# Patient Record
Sex: Male | Born: 1980 | Hispanic: No | Smoking: Never smoker
Health system: Southern US, Community
[De-identification: ages and names within clinical notes are randomized; demographics above are authoritative.]

## PROBLEM LIST (undated history)

## (undated) DIAGNOSIS — B019 Varicella without complication: Secondary | ICD-10-CM

## (undated) DIAGNOSIS — M5126 Other intervertebral disc displacement, lumbar region: Secondary | ICD-10-CM

## (undated) DIAGNOSIS — J069 Acute upper respiratory infection, unspecified: Secondary | ICD-10-CM

## (undated) DIAGNOSIS — J45909 Unspecified asthma, uncomplicated: Secondary | ICD-10-CM

## (undated) DIAGNOSIS — T7840XA Allergy, unspecified, initial encounter: Secondary | ICD-10-CM

## (undated) HISTORY — PX: BACK SURGERY: SHX140

## (undated) HISTORY — DX: Allergy, unspecified, initial encounter: T78.40XA

## (undated) HISTORY — DX: Varicella without complication: B01.9

## (undated) HISTORY — DX: Acute upper respiratory infection, unspecified: J06.9

---

## 2015-09-23 ENCOUNTER — Ambulatory Visit: Payer: Self-pay | Admitting: Orthopedic Surgery

## 2015-10-03 NOTE — Patient Instructions (Addendum)
Darrel ReachChristopher Gullickson  10/03/2015   Your procedure is scheduled on: Thursday 10/13/2015  [facility]  Entrance take [place]  elevators to 3rd floor to  Short Stay Center at 0830 AM.  Call this number if you have problems the morning of surgery [phone]   Remember: ONLY 1 PERSON MAY GO WITH YOU TO SHORT STAY TO GET  READY MORNING OF YOUR SURGERY.   Do not eat food or drink liquids :After Midnight.     Take these medicines the morning of surgery with A SIP OF WATER :ALBUTEROL INHALER IF NEEDED AND BRING INHALER WITH YOU   DO NOT TAKE ANY DIABETIC MEDICATIONS DAY OF YOUR SURGERY                               You may not have any metal on your body including hair pins and              piercings  Do not wear jewelry, make-up, lotions, powders or perfumes, deodorant             Do not wear nail polish.  Do not shave  48 hours prior to surgery.              Men may shave face and neck.   Do not bring valuables to the hospital.  IS NOT             RESPONSIBLE   FOR VALUABLES.  Contacts, dentures or bridgework may not be worn into surgery.  Leave suitcase in the car. After surgery it may be brought to your room.     Patients discharged the day of surgery will not be allowed to drive home.  Name and phone number of your driver:  Special Instructions: N/A              Please read over the following fact sheets you were given: _____________________________________________________________________             [facility] - Pre[facility] Before surgery, you can play an important role.  Because skin is not sterile, your skin needs to be as free of germs as possible.  You can reduce the number of germs on your skin by washing with CHG (chlorahexidine gluconate) soap before surgery.  CHG is an antiseptic cleaner which kills germs and bonds with the skin to continue killing germs even after washing. Please DO NOT use if you have an  allergy to CHG or antibacterial soaps.  If your skin becomes reddened/irritated stop using the CHG and inform your nurse when you arrive at Short Stay. Do not shave (including legs and underarms) for at least 48 hours prior to the first CHG shower.  You may shave your face/neck. Please follow these instructions carefully:  1.  Shower with CHG Soap the night before surgery and the  morning of Surgery.  2.  If you choose to wash your hair, wash your hair first as usual with your  normal  shampoo.  3.  After you shampoo, rinse your hair and body thoroughly to remove the  shampoo.                           4.  Use CHG as you would any other liquid soap.  You can  apply chg directly  to the skin and wash                       Gently with a scrungie or clean washcloth.  5.  Apply the CHG Soap to your body ONLY FROM THE NECK DOWN.   Do not use on face/ open                           Wound or open sores. Avoid contact with eyes, ears mouth and genitals (private parts).                       Wash face,  Genitals (private parts) with your normal soap.             6.  Wash thoroughly, paying special attention to the area where your surgery  will be performed.  7.  Thoroughly rinse your body with warm water from the neck down.  8.  DO NOT shower/wash with your normal soap after using and rinsing off  the CHG Soap.                9.  Pat yourself dry with a clean towel.            10.  Wear clean pajamas.            11.  Place clean sheets on your bed the night of your first shower and do not  sleep with pets. Day of Surgery : Do not apply any lotions/deodorants the morning of surgery.  Please wear clean clothes to the hospital/surgery center.  FAILURE TO FOLLOW THESE INSTRUCTIONS MAY RESULT IN THE CANCELLATION OF YOUR SURGERY PATIENT SIGNATURE_________________________________  NURSE  SIGNATURE__________________________________  ________________________________________________________________________   Adam Phenix  An incentive spirometer is a tool that can help keep your lungs clear and active. This tool measures how well you are filling your lungs with each breath. Taking long deep breaths may help reverse or decrease the chance of developing breathing (pulmonary) problems (especially infection) following:  A long period of time when you are unable to move or be active. BEFORE THE PROCEDURE   If the spirometer includes an indicator to show your best effort, your nurse or respiratory therapist will set it to a desired goal.  If possible, sit up straight or lean slightly forward. Try not to slouch.  Hold the incentive spirometer in an upright position. INSTRUCTIONS FOR USE  1. Sit on the edge of your bed if possible, or sit up as far as you can in bed or on a chair. 2. Hold the incentive spirometer in an upright position. 3. Breathe out normally. 4. Place the mouthpiece in your mouth and seal your lips tightly around it. 5. Breathe in slowly and as deeply as possible, raising the piston or the ball toward the top of the column. 6. Hold your breath for 3-5 seconds or for as long as possible. Allow the piston or ball to fall to the bottom of the column. 7. Remove the mouthpiece from your mouth and breathe out normally. 8. Rest for a few seconds and repeat Steps 1 through 7 at least 10 times every 1-2 hours when you are awake. Take your time and take a few normal breaths between deep breaths. 9. The spirometer may include an indicator to show your best effort. Use the indicator as a goal to work toward during each  repetition. 10. After each set of 10 deep breaths, practice coughing to be sure your lungs are clear. If you have an incision (the cut made at the time of surgery), support your incision when coughing by placing a pillow or rolled up towels firmly  against it. Once you are able to get out of bed, walk around indoors and cough well. You may stop using the incentive spirometer when instructed by your caregiver.  RISKS AND COMPLICATIONS  Take your time so you do not get dizzy or light-headed.  If you are in pain, you may need to take or ask for pain medication before doing incentive spirometry. It is harder to take a deep breath if you are having pain. AFTER USE  Rest and breathe slowly and easily.  It can be helpful to keep track of a log of your progress. Your caregiver can provide you with a simple table to help with this. If you are using the spirometer at home, follow these instructions: Pulcifer IF:   You are having difficultly using the spirometer.  You have trouble using the spirometer as often as instructed.  Your pain medication is not giving enough relief while using the spirometer.  You develop fever of 100.5 F (38.1 C) or higher. SEEK IMMEDIATE MEDICAL CARE IF:   You cough up bloody sputum that had not been present before.  You develop fever of 102 F (38.9 C) or greater.  You develop worsening pain at or near the incision site. MAKE SURE YOU:   Understand these instructions.  Will watch your condition.  Will get help right away if you are not doing well or get worse. Document Released: 03/04/2007 Document Revised: 01/14/2012 Document Reviewed: 05/05/2007 [facility] Patient Information 2014 [place], [place].   ________________________________________________________________________

## 2015-10-04 ENCOUNTER — Other Ambulatory Visit (HOSPITAL_COMMUNITY): Payer: Self-pay | Admitting: *Deleted

## 2015-10-06 ENCOUNTER — Ambulatory Visit (HOSPITAL_COMMUNITY)
Admission: RE | Admit: 2015-10-06 | Discharge: 2015-10-06 | Disposition: A | Discharge status: Home / Self Care | Payer: BLUE CROSS/BLUE SHIELD | Source: Ambulatory Visit | Attending: Orthopedic Surgery | Admitting: Orthopedic Surgery

## 2015-10-06 ENCOUNTER — Encounter (HOSPITAL_COMMUNITY)
Admission: RE | Admit: 2015-10-06 | Discharge: 2015-10-06 | Disposition: A | Discharge status: Home / Self Care | Payer: BLUE CROSS/BLUE SHIELD | Source: Ambulatory Visit | Attending: Specialist | Admitting: Specialist

## 2015-10-06 ENCOUNTER — Ambulatory Visit: Payer: Self-pay | Admitting: Orthopedic Surgery

## 2015-10-06 ENCOUNTER — Encounter (HOSPITAL_COMMUNITY): Payer: Self-pay

## 2015-10-06 DIAGNOSIS — M5126 Other intervertebral disc displacement, lumbar region: Secondary | ICD-10-CM | POA: Diagnosis not present

## 2015-10-06 DIAGNOSIS — M5136 Other intervertebral disc degeneration, lumbar region: Secondary | ICD-10-CM | POA: Insufficient documentation

## 2015-10-06 DIAGNOSIS — Z01812 Encounter for preprocedural laboratory examination: Secondary | ICD-10-CM | POA: Insufficient documentation

## 2015-10-06 DIAGNOSIS — Z01818 Encounter for other preprocedural examination: Secondary | ICD-10-CM | POA: Insufficient documentation

## 2015-10-06 HISTORY — DX: Unspecified asthma, uncomplicated: J45.909

## 2015-10-06 HISTORY — DX: Other intervertebral disc displacement, lumbar region: M51.26

## 2015-10-06 LAB — SURGICAL PCR SCREEN
MRSA, PCR: NEGATIVE
Staphylococcus aureus: NEGATIVE

## 2015-10-06 LAB — CBC
HCT: 44.8 % (ref 39.0–52.0)
Hemoglobin: 15.2 g/dL (ref 13.0–17.0)
MCH: 28.8 pg (ref 26.0–34.0)
MCHC: 33.9 g/dL (ref 30.0–36.0)
MCV: 84.8 fL (ref 78.0–100.0)
Platelets: 171 10*3/uL (ref 150–400)
RBC: 5.28 MIL/uL (ref 4.22–5.81)
RDW: 12.5 % (ref 11.5–15.5)
WBC: 8.9 10*3/uL (ref 4.0–10.5)

## 2015-10-06 LAB — BASIC METABOLIC PANEL WITH GFR
Anion gap: 6 (ref 5–15)
BUN: 13 mg/dL (ref 6–20)
CO2: 29 mmol/L (ref 22–32)
Calcium: 9.3 mg/dL (ref 8.9–10.3)
Chloride: 103 mmol/L (ref 101–111)
Creatinine, Ser: 0.95 mg/dL (ref 0.61–1.24)
GFR calc Af Amer: >60 mL/min
GFR calc non Af Amer: >60 mL/min
Glucose, Bld: 109 mg/dL — ABNORMAL HIGH (ref 65–99)
Potassium: 4.6 mmol/L (ref 3.5–5.1)
Sodium: 138 mmol/L (ref 135–145)

## 2015-10-06 NOTE — H&P (Signed)
Blake Luna is an 34 y.o. male.   Chief Complaint: back and right leg pain HPI: The patient is a 34 year old male who presents today for follow up of their back. The patient is being followed for their back pain. They are now 8 month(s) out from when symptoms began. Symptoms reported today include: pain (right with certain movements of the right leg), leg pain (right but is better as far as ROM) and pain with sitting. The following medication has been used for pain control: none. The patient reports their current pain level to be 2 / 10. The patient presents today following MRI (08/31/2015). The patient has not gotten any relief of their symptoms with Cortisone injections. The patient indicates that they have questions or concerns today regarding surgery that is scehduled for 10/13/2015.  [name] follows up with back. He is here to discuss surgery. The surgery is scheduled for December 8. He reports he has tried to increase his level of activity. He has had no success with it. He owns his own business. This has now been a considerable period of time.  Past Medical History  Diagnosis Date  . Asthma   . HNP (herniated nucleus pulposus), lumbar     Past Surgical History  Procedure Laterality Date  . No past surgeries      No family history on file. Social History:  reports that he has never smoked. He does not have any smokeless tobacco history on file. He reports that he drinks alcohol. He reports that he does not use illicit drugs.  Allergies:  Allergies  Allergen Reactions  . Amoxicillin Other (See Comments)    Upset stomach     (Not in a hospital admission)  Results for orders placed or performed during the hospital encounter of 10/06/15 (from the past 48 hour(s))  Surgical pcr screen     Status: None   Collection Time: 10/06/15  8:10 AM  Result Value Ref Range   MRSA, PCR NEGATIVE NEGATIVE   Staphylococcus aureus NEGATIVE NEGATIVE    Comment:         The Xpert SA Assay (FDA approved for NASAL specimens in patients over 65 years of age), is one component of a comprehensive surveillance program.  Test performance has been validated by [facility] for patients greater than or equal to 33 year old. It is not intended to diagnose infection nor to guide or monitor treatment.   Basic metabolic panel     Status: Abnormal   Collection Time: 10/06/15  8:45 AM  Result Value Ref Range   Sodium 138 135 - 145 mmol/L   Potassium 4.6 3.5 - 5.1 mmol/L   Chloride 103 101 - 111 mmol/L   CO2 29 22 - 32 mmol/L   Glucose, Bld 109 (H) 65 - 99 mg/dL   BUN 13 6 - 20 mg/dL   Creatinine, Ser 0.95 0.61 - 1.24 mg/dL   Calcium 9.3 8.9 - 10.3 mg/dL   GFR calc non Af Amer >60 >60 mL/min   GFR calc Af Amer >60 >60 mL/min    Comment: (NOTE) The eGFR has been calculated using the CKD EPI equation. This calculation has not been validated in all clinical situations. eGFR's persistently <60 mL/min signify possible Chronic Kidney Disease.    Anion gap 6 5 - 15  CBC     Status: None   Collection Time: 10/06/15  8:45 AM  Result Value Ref Range   WBC 8.9 4.0 - 10.5 K/uL  RBC 5.28 4.22 - 5.81 MIL/uL   Hemoglobin 15.2 13.0 - 17.0 g/dL   HCT 44.8 39.0 - 52.0 %   MCV 84.8 78.0 - 100.0 fL   MCH 28.8 26.0 - 34.0 pg   MCHC 33.9 30.0 - 36.0 g/dL   RDW 12.5 11.5 - 15.5 %   Platelets 171 150 - 400 K/uL   Dg Lumbar Spine 2-3 Views  10/06/2015  CLINICAL DATA:  Preop decompression L5-S1 EXAM: LUMBAR SPINE - 2-3 VIEW COMPARISON:  None. FINDINGS: 5 lumbar vertebra. These are labeled for preoperative planning purposes. Normal alignment. Negative for fracture. Mild disc space narrowing L5-S1. Mild disc degeneration L1-2 and L2-3. No pars defect or focal bony abnormality identified. SI joints intact. IMPRESSION: Mild lumbar disc degeneration without acute bony abnormality Electronically Signed   By: Franchot Gallo M.D.   On: 10/06/2015 09:23    Review of Systems   Constitutional: Negative.   HENT: Negative.   Eyes: Negative.   Respiratory: Negative.   Cardiovascular: Negative.   Gastrointestinal: Negative.   Genitourinary: Negative.   Musculoskeletal: Positive for back pain.  Skin: Negative.   Neurological: Positive for sensory change and focal weakness.    There were no vitals taken for this visit. Physical Exam  Constitutional: He is oriented to person, place, and time. He appears well-developed.  HENT:  Head: Normocephalic.  Eyes: Pupils are equal, round, and reactive to light.  Neck: Normal range of motion.  Cardiovascular: Normal rate.   Respiratory: Effort normal.  GI: Soft.  Musculoskeletal:  On exam, straight leg raise is marked buttock thigh and calf pain on the right. Negative on the left. EHL is trace 5-/5.  Lumbar spine exam reveals no evidence of soft tissue swelling, deformity or skin ecchymosis. On palpation there is no tenderness of the lumbar spine. No flank pain with percussion. The abdomen is soft and nontender. Nontender over the trochanters. No cellulitis or lymphadenopathy.  Good range of motion of the lumbar spine without associated pain. Motor is 5/5 including tibialis anterior, plantar flexion, quadriceps and hamstrings. Patient is normoreflexic. There is no Babinski or clonus. Sensory exam is intact to light touch. Patient has good distal pulses. No DVT. No pain and normal range of motion without instability of the hips, knees and ankles.  Neurological: He is alert and oriented to person, place, and time.    MRI of the lumbar spine demonstrates a moderate to large disc herniation at L5-S1 on the right with displacement of the S1 nerve root. There has been no interval change since his previous MRI performed back in May or back in early June.  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   Again, we had a long discussion concerning his current pathology, relevant anatomy, and treatment options. Given the significant neural tension signs, negative impact on his activities of daily living and the failure of regression of the disc herniation by serial MRI, we mutually agreed to proceed with a lumbar decompression at L5-S1.  I had an extensive discussion of the risks and benefits of the lumbar decompression with the patient including bleeding, infection, damage to neurovascular structures, epidural fibrosis, CSF leak requiring repair. We also discussed increase in pain, adjacent segment disease, recurrent disc herniation, need for future surgery including repeat decompression and/or fusion. We also discussed risks of postoperative hematoma, paralysis, anesthetic complications including DVT, PE, death, cardiopulmonary dysfunction.  In addition, the perioperative and postoperative courses were discussed in detail including the rehabilitative time and return to functional activity and work. I provided the patient with an illustrated handout and utilized the appropriate surgical models.  Postoperatively, we discussed time out of work, sutures out in two weeks, return to physical therapy. We would appreciate if he could be reevaluated and have Dr. Janalyn Shy in [place] assist [place] in postoperative course as well. We will proceed accordingly.  Plan microlumbar decompression L5-S1 right  BISSELL, JACLYN M. PA-C for Dr. Tonita Cong 10/06/2015, 4:39 PM

## 2015-10-10 NOTE — Anesthesia Preprocedure Evaluation (Addendum)
Anesthesia Evaluation  Patient identified by MRN, date of birth, ID band Patient awake    Reviewed: Allergy & Precautions, NPO status , Patient's Chart, lab work & pertinent test results  Airway Mallampati: II   Neck ROM: Full    Dental  (+) Teeth Intact, Dental Advisory Given   Pulmonary neg pulmonary ROS,    breath sounds clear to auscultation       Cardiovascular negative cardio ROS   Rhythm:Regular     Neuro/Psych negative neurological ROS  negative psych ROS   GI/Hepatic negative GI ROS, Neg liver ROS,   Endo/Other  negative endocrine ROS  Renal/GU negative Renal ROS  negative genitourinary   Musculoskeletal negative musculoskeletal ROS (+)   Abdominal (+)  Abdomen: soft.    Peds negative pediatric ROS (+)  Hematology negative hematology ROS (+)   Anesthesia Other Findings   Reproductive/Obstetrics negative OB ROS                            Anesthesia Physical Anesthesia Plan  ASA: I  Anesthesia Plan: General   Post-op Pain Management:    Induction: Intravenous  Airway Management Planned: Oral ETT  Additional Equipment:   Intra-op Plan:   Post-operative Plan: Extubation in OR  Informed Consent: I have reviewed the patients History and Physical, chart, labs and discussed the procedure including the risks, benefits and alternatives for the proposed anesthesia with the patient or authorized representative who has indicated his/her understanding and acceptance.     Plan Discussed with:   Anesthesia Plan Comments:         Anesthesia Quick Evaluation

## 2015-10-12 MED ORDER — DEXTROSE 5 % IV SOLN
3.0000 g | INTRAVENOUS | Status: DC
  Filled 2015-10-12: qty 3000

## 2015-10-13 ENCOUNTER — Ambulatory Visit (HOSPITAL_COMMUNITY): Payer: BLUE CROSS/BLUE SHIELD

## 2015-10-13 ENCOUNTER — Ambulatory Visit (HOSPITAL_COMMUNITY): Payer: BLUE CROSS/BLUE SHIELD | Admitting: Anesthesiology

## 2015-10-13 ENCOUNTER — Encounter (HOSPITAL_COMMUNITY): Payer: Self-pay | Admitting: *Deleted

## 2015-10-13 ENCOUNTER — Encounter (HOSPITAL_COMMUNITY)
Admission: RE | Disposition: A | Discharge status: Home / Self Care | Payer: Self-pay | Source: Ambulatory Visit | Attending: Specialist

## 2015-10-13 ENCOUNTER — Ambulatory Visit (HOSPITAL_COMMUNITY)
Admission: RE | Admit: 2015-10-13 | Discharge: 2015-10-14 | Disposition: A | Discharge status: Home / Self Care | Payer: BLUE CROSS/BLUE SHIELD | Source: Ambulatory Visit | Attending: Specialist | Admitting: Specialist

## 2015-10-13 DIAGNOSIS — M4806 Spinal stenosis, lumbar region: Secondary | ICD-10-CM | POA: Diagnosis present

## 2015-10-13 DIAGNOSIS — J45909 Unspecified asthma, uncomplicated: Secondary | ICD-10-CM | POA: Insufficient documentation

## 2015-10-13 DIAGNOSIS — M549 Dorsalgia, unspecified: Secondary | ICD-10-CM

## 2015-10-13 DIAGNOSIS — M5116 Intervertebral disc disorders with radiculopathy, lumbar region: Secondary | ICD-10-CM | POA: Diagnosis not present

## 2015-10-13 DIAGNOSIS — M5126 Other intervertebral disc displacement, lumbar region: Secondary | ICD-10-CM | POA: Diagnosis present

## 2015-10-13 HISTORY — PX: LUMBAR LAMINECTOMY/DECOMPRESSION MICRODISCECTOMY: SHX5026

## 2015-10-13 SURGERY — LUMBAR LAMINECTOMY/DECOMPRESSION MICRODISCECTOMY
Anesthesia: General | Site: Back | Laterality: Right

## 2015-10-13 MED ORDER — METHOCARBAMOL 500 MG PO TABS
500.0000 mg | ORAL_TABLET | Freq: Four times a day (QID) | ORAL | Status: DC | PRN
  Administered 2015-10-13: 500 mg via ORAL
  Filled 2015-10-13: qty 1

## 2015-10-13 MED ORDER — ONDANSETRON HCL 4 MG/2ML IJ SOLN
INTRAMUSCULAR | Status: DC | PRN
  Administered 2015-10-13: 4 mg via INTRAVENOUS

## 2015-10-13 MED ORDER — THROMBIN 5000 UNITS EX SOLR
OROMUCOSAL | Status: DC | PRN
  Administered 2015-10-13: 10 mL via TOPICAL

## 2015-10-13 MED ORDER — PHENOL 1.4 % MT LIQD: 1.0000 | OROMUCOSAL | Status: DC | PRN

## 2015-10-13 MED ORDER — THROMBIN 5000 UNITS EX SOLR
CUTANEOUS | Status: AC
  Filled 2015-10-13: qty 10000

## 2015-10-13 MED ORDER — FENTANYL CITRATE (PF) 100 MCG/2ML IJ SOLN
INTRAMUSCULAR | Status: DC | PRN
  Administered 2015-10-13: 50 ug via INTRAVENOUS
  Administered 2015-10-13: 100 ug via INTRAVENOUS
  Administered 2015-10-13: 50 ug via INTRAVENOUS

## 2015-10-13 MED ORDER — BUPIVACAINE-EPINEPHRINE 0.5% -1:200000 IJ SOLN
INTRAMUSCULAR | Status: DC | PRN
  Administered 2015-10-13: 15 mL

## 2015-10-13 MED ORDER — DEXAMETHASONE SODIUM PHOSPHATE 10 MG/ML IJ SOLN
INTRAMUSCULAR | Status: AC
  Filled 2015-10-13: qty 1

## 2015-10-13 MED ORDER — ACETAMINOPHEN 650 MG RE SUPP: 650.0000 mg | RECTAL | Status: DC | PRN

## 2015-10-13 MED ORDER — LIDOCAINE HCL (CARDIAC) 20 MG/ML IV SOLN
INTRAVENOUS | Status: DC | PRN
  Administered 2015-10-13: 100 mg via INTRAVENOUS

## 2015-10-13 MED ORDER — ROCURONIUM BROMIDE 100 MG/10ML IV SOLN
INTRAVENOUS | Status: DC | PRN
  Administered 2015-10-13 (×3): 10 mg via INTRAVENOUS
  Administered 2015-10-13: 60 mg via INTRAVENOUS

## 2015-10-13 MED ORDER — ALUM & MAG HYDROXIDE-SIMETH 200-200-20 MG/5ML PO SUSP: 30.0000 mL | Freq: Four times a day (QID) | ORAL | Status: DC | PRN

## 2015-10-13 MED ORDER — POLYMYXIN B SULFATE 500000 UNITS IJ SOLR
INTRAMUSCULAR | Status: AC
  Filled 2015-10-13: qty 1

## 2015-10-13 MED ORDER — PROMETHAZINE HCL 25 MG/ML IJ SOLN: 6.2500 mg | INTRAMUSCULAR | Status: DC | PRN

## 2015-10-13 MED ORDER — BUPIVACAINE-EPINEPHRINE (PF) 0.5% -1:200000 IJ SOLN
INTRAMUSCULAR | Status: AC
  Filled 2015-10-13: qty 30

## 2015-10-13 MED ORDER — PROPOFOL 10 MG/ML IV BOLUS
INTRAVENOUS | Status: AC
  Filled 2015-10-13: qty 40

## 2015-10-13 MED ORDER — HYDROCODONE-ACETAMINOPHEN 5-325 MG PO TABS
1.0000 | ORAL_TABLET | ORAL | Status: DC | PRN
  Administered 2015-10-13 – 2015-10-14 (×5): 1 via ORAL
  Filled 2015-10-13 (×5): qty 1

## 2015-10-13 MED ORDER — FENTANYL CITRATE (PF) 100 MCG/2ML IJ SOLN: 25.0000 ug | INTRAMUSCULAR | Status: DC | PRN

## 2015-10-13 MED ORDER — DEXAMETHASONE SODIUM PHOSPHATE 10 MG/ML IJ SOLN
INTRAMUSCULAR | Status: DC | PRN
  Administered 2015-10-13: 10 mg via INTRAVENOUS

## 2015-10-13 MED ORDER — MEPERIDINE HCL 50 MG/ML IJ SOLN: 6.2500 mg | INTRAMUSCULAR | Status: DC | PRN

## 2015-10-13 MED ORDER — HYDROMORPHONE HCL 1 MG/ML IJ SOLN: 0.5000 mg | INTRAMUSCULAR | Status: DC | PRN

## 2015-10-13 MED ORDER — SENNOSIDES-DOCUSATE SODIUM 8.6-50 MG PO TABS: 1.0000 | ORAL_TABLET | Freq: Every evening | ORAL | Status: DC | PRN

## 2015-10-13 MED ORDER — DOCUSATE SODIUM 100 MG PO CAPS: 100.0000 mg | ORAL_CAPSULE | Freq: Two times a day (BID) | ORAL | Status: DC | PRN

## 2015-10-13 MED ORDER — ROCURONIUM BROMIDE 100 MG/10ML IV SOLN
INTRAVENOUS | Status: AC
  Filled 2015-10-13: qty 1

## 2015-10-13 MED ORDER — ONDANSETRON HCL 4 MG/2ML IJ SOLN
INTRAMUSCULAR | Status: AC
  Filled 2015-10-13: qty 2

## 2015-10-13 MED ORDER — ACETAMINOPHEN 10 MG/ML IV SOLN
INTRAVENOUS | Status: AC
  Filled 2015-10-13: qty 100

## 2015-10-13 MED ORDER — LACTATED RINGERS IV SOLN
INTRAVENOUS | Status: DC
  Administered 2015-10-13: 12:00:00 via INTRAVENOUS
  Administered 2015-10-13: 1000 mL via INTRAVENOUS

## 2015-10-13 MED ORDER — LACTATED RINGERS IV SOLN: INTRAVENOUS | Status: DC

## 2015-10-13 MED ORDER — RISAQUAD PO CAPS
1.0000 | ORAL_CAPSULE | Freq: Every day | ORAL | Status: DC
  Administered 2015-10-14: 1 via ORAL
  Filled 2015-10-13 (×2): qty 1

## 2015-10-13 MED ORDER — METHOCARBAMOL 1000 MG/10ML IJ SOLN
500.0000 mg | Freq: Four times a day (QID) | INTRAVENOUS | Status: DC | PRN
  Administered 2015-10-13: 500 mg via INTRAVENOUS
  Filled 2015-10-13 (×2): qty 5

## 2015-10-13 MED ORDER — SUGAMMADEX SODIUM 200 MG/2ML IV SOLN
INTRAVENOUS | Status: DC | PRN
  Administered 2015-10-13: 200 mg via INTRAVENOUS

## 2015-10-13 MED ORDER — CEFAZOLIN SODIUM-DEXTROSE 2-3 GM-% IV SOLR
INTRAVENOUS | Status: AC
  Filled 2015-10-13: qty 50

## 2015-10-13 MED ORDER — OXYCODONE-ACETAMINOPHEN 5-325 MG PO TABS: 1.0000 | ORAL_TABLET | ORAL | Status: DC | PRN

## 2015-10-13 MED ORDER — ACETAMINOPHEN 10 MG/ML IV SOLN
1000.0000 mg | Freq: Once | INTRAVENOUS | Status: AC
  Administered 2015-10-13: 1000 mg via INTRAVENOUS

## 2015-10-13 MED ORDER — MIDAZOLAM HCL 2 MG/2ML IJ SOLN
INTRAMUSCULAR | Status: AC
  Filled 2015-10-13: qty 2

## 2015-10-13 MED ORDER — FENTANYL CITRATE (PF) 250 MCG/5ML IJ SOLN
INTRAMUSCULAR | Status: AC
  Filled 2015-10-13: qty 5

## 2015-10-13 MED ORDER — MIDAZOLAM HCL 5 MG/5ML IJ SOLN
INTRAMUSCULAR | Status: DC | PRN
  Administered 2015-10-13: 2 mg via INTRAVENOUS

## 2015-10-13 MED ORDER — MENTHOL 3 MG MT LOZG: 1.0000 | LOZENGE | OROMUCOSAL | Status: DC | PRN

## 2015-10-13 MED ORDER — SUGAMMADEX SODIUM 200 MG/2ML IV SOLN
INTRAVENOUS | Status: AC
  Filled 2015-10-13: qty 2

## 2015-10-13 MED ORDER — ACETAMINOPHEN 325 MG PO TABS: 650.0000 mg | ORAL_TABLET | ORAL | Status: DC | PRN

## 2015-10-13 MED ORDER — KCL IN DEXTROSE-NACL 20-5-0.45 MEQ/L-%-% IV SOLN
INTRAVENOUS | Status: DC
  Filled 2015-10-13 (×2): qty 1000

## 2015-10-13 MED ORDER — DOCUSATE SODIUM 100 MG PO CAPS
100.0000 mg | ORAL_CAPSULE | Freq: Two times a day (BID) | ORAL | Status: DC
  Administered 2015-10-13 – 2015-10-14 (×2): 100 mg via ORAL

## 2015-10-13 MED ORDER — SODIUM CHLORIDE 0.9 % IR SOLN
Status: DC | PRN
  Administered 2015-10-13: 500 mL

## 2015-10-13 MED ORDER — LIDOCAINE HCL (CARDIAC) 20 MG/ML IV SOLN
INTRAVENOUS | Status: AC
  Filled 2015-10-13: qty 5

## 2015-10-13 MED ORDER — PROPOFOL 10 MG/ML IV BOLUS
INTRAVENOUS | Status: DC | PRN
  Administered 2015-10-13: 200 mg via INTRAVENOUS
  Administered 2015-10-13: 50 mg via INTRAVENOUS

## 2015-10-13 MED ORDER — METHOCARBAMOL 500 MG PO TABS: 500.0000 mg | ORAL_TABLET | Freq: Three times a day (TID) | ORAL | Status: DC | PRN

## 2015-10-13 MED ORDER — CEFAZOLIN SODIUM-DEXTROSE 2-3 GM-% IV SOLR
2.0000 g | Freq: Once | INTRAVENOUS | Status: AC
  Administered 2015-10-13: 2 g via INTRAVENOUS

## 2015-10-13 MED ORDER — CEFAZOLIN SODIUM-DEXTROSE 2-3 GM-% IV SOLR
2.0000 g | Freq: Three times a day (TID) | INTRAVENOUS | Status: AC
  Administered 2015-10-13 – 2015-10-14 (×3): 2 g via INTRAVENOUS
  Filled 2015-10-13 (×3): qty 50

## 2015-10-13 MED ORDER — ONDANSETRON HCL 4 MG/2ML IJ SOLN
4.0000 mg | INTRAMUSCULAR | Status: DC | PRN
  Administered 2015-10-13 (×2): 4 mg via INTRAVENOUS
  Filled 2015-10-13 (×2): qty 2

## 2015-10-13 MED ORDER — ALBUTEROL SULFATE (2.5 MG/3ML) 0.083% IN NEBU: 2.5000 mg | INHALATION_SOLUTION | Freq: Four times a day (QID) | RESPIRATORY_TRACT | Status: DC | PRN

## 2015-10-13 SURGICAL SUPPLY — 42 items
BAG ZIPLOCK 12X15 (MISCELLANEOUS) IMPLANT
CLOTH 2% CHLOROHEXIDINE 3PK (PERSONAL CARE ITEMS) ×2 IMPLANT
DRAPE MICROSCOPE LEICA (MISCELLANEOUS) ×2 IMPLANT
DRAPE SHEET LG 3/4 BI-LAMINATE (DRAPES) ×2 IMPLANT
DRAPE SURG 17X11 SM STRL (DRAPES) ×2 IMPLANT
DRAPE UTILITY XL STRL (DRAPES) ×2 IMPLANT
DRSG AQUACEL AG ADV 3.5X 4 (GAUZE/BANDAGES/DRESSINGS) ×2 IMPLANT
DRSG AQUACEL AG ADV 3.5X 6 (GAUZE/BANDAGES/DRESSINGS) IMPLANT
DURAPREP 26ML APPLICATOR (WOUND CARE) ×2 IMPLANT
DURASEAL SPINE SEALANT 3ML (MISCELLANEOUS) IMPLANT
ELECT BLADE TIP CTD 4 INCH (ELECTRODE) ×2 IMPLANT
ELECT REM PT RETURN 9FT ADLT (ELECTROSURGICAL) ×2
ELECTRODE REM PT RTRN 9FT ADLT (ELECTROSURGICAL) ×1 IMPLANT
GLOVE BIOGEL PI IND STRL 7.0 (GLOVE) ×1 IMPLANT
GLOVE BIOGEL PI INDICATOR 7.0 (GLOVE) ×1
GLOVE SURG SS PI 7.0 STRL IVOR (GLOVE) ×2 IMPLANT
GLOVE SURG SS PI 7.5 STRL IVOR (GLOVE) IMPLANT
GLOVE SURG SS PI 8.0 STRL IVOR (GLOVE) ×4 IMPLANT
GOWN STRL REUS W/TWL XL LVL3 (GOWN DISPOSABLE) ×4 IMPLANT
IV CATH 14GX2 1/4 (CATHETERS) ×2 IMPLANT
KIT BASIN OR (CUSTOM PROCEDURE TRAY) ×2 IMPLANT
KIT POSITIONING SURG ANDREWS (MISCELLANEOUS) ×2 IMPLANT
MANIFOLD NEPTUNE II (INSTRUMENTS) ×2 IMPLANT
NEEDLE SPNL 18GX3.5 QUINCKE PK (NEEDLE) ×6 IMPLANT
PACK LAMINECTOMY ORTHO (CUSTOM PROCEDURE TRAY) ×2 IMPLANT
PATTIES SURGICAL .5 X.5 (GAUZE/BANDAGES/DRESSINGS) IMPLANT
PATTIES SURGICAL .75X.75 (GAUZE/BANDAGES/DRESSINGS) ×2 IMPLANT
RUBBERBAND STERILE (MISCELLANEOUS) ×4 IMPLANT
SPONGE SURGIFOAM ABS GEL 100 (HEMOSTASIS) ×2 IMPLANT
STAPLER VISISTAT (STAPLE) IMPLANT
STRIP CLOSURE SKIN 1/2X4 (GAUZE/BANDAGES/DRESSINGS) ×2 IMPLANT
SUT NURALON 4 0 TR CR/8 (SUTURE) IMPLANT
SUT PROLENE 3 0 PS 2 (SUTURE) ×2 IMPLANT
SUT VIC AB 1 CT1 27 (SUTURE) ×1
SUT VIC AB 1 CT1 27XBRD ANTBC (SUTURE) ×1 IMPLANT
SUT VIC AB 1-0 CT2 27 (SUTURE) IMPLANT
SUT VIC AB 2-0 CT1 27 (SUTURE) ×2
SUT VIC AB 2-0 CT1 TAPERPNT 27 (SUTURE) ×2 IMPLANT
SUT VIC AB 2-0 CT2 27 (SUTURE) IMPLANT
SYR 3ML LL SCALE MARK (SYRINGE) ×2 IMPLANT
TOWEL OR 17X26 10 PK STRL BLUE (TOWEL DISPOSABLE) ×2 IMPLANT
YANKAUER SUCT BULB TIP NO VENT (SUCTIONS) ×2 IMPLANT

## 2015-10-13 NOTE — Transfer of Care (Signed)
Immediate Anesthesia Transfer of Care Note  Patient: Blake Luna  Procedure(s) Performed: Procedure(s): MICRO LUMBAR DECOMPRESSION L5 - S1 ON THE RIGHT, with specimen of l5-s1 (Right)  Patient Location: PACU  Anesthesia Type:General  Level of Consciousness: awake, alert  and oriented  Airway & Oxygen Therapy: Patient Spontanous Breathing and Patient connected to face mask oxygen  Post-op Assessment: Report given to RN and Post -op Vital signs reviewed and stable  Post vital signs: Reviewed and stable  Last Vitals:  Filed Vitals:   10/13/15 0825  BP: 155/83  Pulse: 73  Temp: 36.7 C  Resp: 18    Complications: No apparent anesthesia complications

## 2015-10-13 NOTE — Anesthesia Postprocedure Evaluation (Signed)
Anesthesia Post Note  Patient: Blake ReachChristopher Luna  Procedure(s) Performed: Procedure(s) (LRB): MICRO LUMBAR DECOMPRESSION L5 - S1 ON THE RIGHT, with specimen of l5-s1 (Right)  Patient location during evaluation: PACU Anesthesia Type: General Level of consciousness: awake and alert Pain management: pain level controlled Vital Signs Assessment: post-procedure vital signs reviewed and stable Respiratory status: spontaneous breathing, nonlabored ventilation, respiratory function stable and patient connected to nasal cannula oxygen Cardiovascular status: blood pressure returned to baseline and stable Postop Assessment: no signs of nausea or vomiting Anesthetic complications: no    Last Vitals:  Filed Vitals:   10/13/15 0825  BP: 155/83  Pulse: 73  Temp: 36.7 C  Resp: 18    Last Pain: There were no vitals filed for this visit.               Miyana Mordecai

## 2015-10-13 NOTE — H&P (View-Only) (Signed)
Blake Luna is an 34 y.o. male.   Chief Complaint: back and right leg pain HPI: The patient is a 34 year old male who presents today for follow up of their back. The patient is being followed for their back pain. They are now 8 month(s) out from when symptoms began. Symptoms reported today include: pain (right with certain movements of the right leg), leg pain (right but is better as far as ROM) and pain with sitting. The following medication has been used for pain control: none. The patient reports their current pain level to be 2 / 10. The patient presents today following MRI (08/31/2015). The patient has not gotten any relief of their symptoms with Cortisone injections. The patient indicates that they have questions or concerns today regarding surgery that is scehduled for 10/13/2015.  Blake Luna follows up with back. He is here to discuss surgery. The surgery is scheduled for December 8. He reports he has tried to increase his level of activity. He has had no success with it. He owns his own business. This has now been a considerable period of time.  Past Medical History  Diagnosis Date  . Asthma   . HNP (herniated nucleus pulposus), lumbar     Past Surgical History  Procedure Laterality Date  . No past surgeries      No family history on file. Social History:  reports that he has never smoked. He does not have any smokeless tobacco history on file. He reports that he drinks alcohol. He reports that he does not use illicit drugs.  Allergies:  Allergies  Allergen Reactions  . Amoxicillin Other (See Comments)    Upset stomach     (Not in a hospital admission)  Results for orders placed or performed during the hospital encounter of 10/06/15 (from the past 48 hour(s))  Surgical pcr screen     Status: None   Collection Time: 10/06/15  8:10 AM  Result Value Ref Range   MRSA, PCR NEGATIVE NEGATIVE   Staphylococcus aureus NEGATIVE NEGATIVE    Comment:         The Xpert SA Assay (FDA approved for NASAL specimens in patients over 59 years of age), is one component of a comprehensive surveillance program.  Test performance has been validated by [facility] for patients greater than or equal to 29 year old. It is not intended to diagnose infection nor to guide or monitor treatment.   Basic metabolic panel     Status: Abnormal   Collection Time: 10/06/15  8:45 AM  Result Value Ref Range   Sodium 138 135 - 145 mmol/L   Potassium 4.6 3.5 - 5.1 mmol/L   Chloride 103 101 - 111 mmol/L   CO2 29 22 - 32 mmol/L   Glucose, Bld 109 (H) 65 - 99 mg/dL   BUN 13 6 - 20 mg/dL   Creatinine, Ser 0.95 0.61 - 1.24 mg/dL   Calcium 9.3 8.9 - 10.3 mg/dL   GFR calc non Af Amer >60 >60 mL/min   GFR calc Af Amer >60 >60 mL/min    Comment: (NOTE) The eGFR has been calculated using the CKD EPI equation. This calculation has not been validated in all clinical situations. eGFR's persistently <60 mL/min signify possible Chronic Kidney Disease.    Anion gap 6 5 - 15  CBC     Status: None   Collection Time: 10/06/15  8:45 AM  Result Value Ref Range   WBC 8.9 4.0 - 10.5 K/uL  RBC 5.28 4.22 - 5.81 MIL/uL   Hemoglobin 15.2 13.0 - 17.0 g/dL   HCT 44.8 39.0 - 52.0 %   MCV 84.8 78.0 - 100.0 fL   MCH 28.8 26.0 - 34.0 pg   MCHC 33.9 30.0 - 36.0 g/dL   RDW 12.5 11.5 - 15.5 %   Platelets 171 150 - 400 K/uL   Dg Lumbar Spine 2-3 Views  10/06/2015  CLINICAL DATA:  Preop decompression L5-S1 EXAM: LUMBAR SPINE - 2-3 VIEW COMPARISON:  None. FINDINGS: 5 lumbar vertebra. These are labeled for preoperative planning purposes. Normal alignment. Negative for fracture. Mild disc space narrowing L5-S1. Mild disc degeneration L1-2 and L2-3. No pars defect or focal bony abnormality identified. SI joints intact. IMPRESSION: Mild lumbar disc degeneration without acute bony abnormality Electronically Signed   By: Franchot Gallo M.D.   On: 10/06/2015 09:23    Review of Systems   Constitutional: Negative.   HENT: Negative.   Eyes: Negative.   Respiratory: Negative.   Cardiovascular: Negative.   Gastrointestinal: Negative.   Genitourinary: Negative.   Musculoskeletal: Positive for back pain.  Skin: Negative.   Neurological: Positive for sensory change and focal weakness.    There were no vitals taken for this visit. Physical Exam  Constitutional: He is oriented to person, place, and time. He appears well-developed.  HENT:  Head: Normocephalic.  Eyes: Pupils are equal, round, and reactive to light.  Neck: Normal range of motion.  Cardiovascular: Normal rate.   Respiratory: Effort normal.  GI: Soft.  Musculoskeletal:  On exam, straight leg raise is marked buttock thigh and calf pain on the right. Negative on the left. EHL is trace 5-/5.  Lumbar spine exam reveals no evidence of soft tissue swelling, deformity or skin ecchymosis. On palpation there is no tenderness of the lumbar spine. No flank pain with percussion. The abdomen is soft and nontender. Nontender over the trochanters. No cellulitis or lymphadenopathy.  Good range of motion of the lumbar spine without associated pain. Motor is 5/5 including tibialis anterior, plantar flexion, quadriceps and hamstrings. Patient is normoreflexic. There is no Babinski or clonus. Sensory exam is intact to light touch. Patient has good distal pulses. No DVT. No pain and normal range of motion without instability of the hips, knees and ankles.  Neurological: He is alert and oriented to person, place, and time.    MRI of the lumbar spine demonstrates a moderate to large disc herniation at L5-S1 on the right with displacement of the S1 nerve root. There has been no interval change since his previous MRI performed back in May or back in early June.  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   Again, we had a long discussion concerning his current pathology, relevant anatomy, and treatment options. Given the significant neural tension signs, negative impact on his activities of daily living and the failure of regression of the disc herniation by serial MRI, we mutually agreed to proceed with a lumbar decompression at L5-S1.  I had an extensive discussion of the risks and benefits of the lumbar decompression with the patient including bleeding, infection, damage to neurovascular structures, epidural fibrosis, CSF leak requiring repair. We also discussed increase in pain, adjacent segment disease, recurrent disc herniation, need for future surgery including repeat decompression and/or fusion. We also discussed risks of postoperative hematoma, paralysis, anesthetic complications including DVT, PE, death, cardiopulmonary dysfunction.  In addition, the perioperative and postoperative courses were discussed in detail including the rehabilitative time and return to functional activity and work. I provided the patient with an illustrated handout and utilized the appropriate surgical models.  Postoperatively, we discussed time out of work, sutures out in two weeks, return to physical therapy. We would appreciate if he could be reevaluated and have Dr. Janalyn Shy in [place] assist [place] in postoperative course as well. We will proceed accordingly.  Plan microlumbar decompression L5-S1 right  Jemya Depierro M. PA-C for Dr. Tonita Cong 10/06/2015, 4:39 PM

## 2015-10-13 NOTE — Brief Op Note (Signed)
10/13/2015  12:30 PM  PATIENT:  Blake ReachChristopher Feldkamp  34 y.o. male  PRE-OPERATIVE DIAGNOSIS:  HNP L5 - S1 on the right  POST-OPERATIVE DIAGNOSIS:  HNP L5 - S1 on the right  PROCEDURE:  Procedure(s): MICRO LUMBAR DECOMPRESSION L5 - S1 ON THE RIGHT, with specimen of l5-s1 (Right)  SURGEON:  Surgeon(s) and Role:    * Jene EveryJeffrey Anette Barra, MD - Primary  PHYSICIAN ASSISTANT:   ASSISTANTS: Bissell   ANESTHESIA:   general  EBL:  Total I/O In: 1000 [I.V.:1000] Out: -   BLOOD ADMINISTERED:none  DRAINS: none   LOCAL MEDICATIONS USED:  MARCAINE     SPECIMEN:  No Specimen  DISPOSITION OF SPECIMEN:  PATHOLOGY  COUNTS:  YES  TOURNIQUET:  * No tourniquets in log *  DICTATION: .Other Dictation: Dictation Number [id number]  PLAN OF CARE: Admit for overnight observation  PATIENT DISPOSITION:  PACU - hemodynamically stable.   Delay start of Pharmacological VTE agent (>24hrs) due to surgical blood loss or risk of bleeding: yes

## 2015-10-13 NOTE — Discharge Instructions (Signed)
Walk As Tolerated utilizing back precautions.  No bending, twisting, or lifting.  No driving for 2 weeks.   °Aquacel dressing may remain in place until follow up. May shower with aquacel dressing in place. If the dressing peels off or becomes saturated, you may remove aquacel dressing and place gauze and tape dressing which should be kept clean and dry and changed daily. Do not remove steri-strips if they are present. °See Dr. Mikea Quadros in office in 10 to 14 days. Begin taking aspirin 81mg per day starting 4 days after your surgery if not allergic to aspirin or on another blood thinner. °Walk daily even outside. Use a cane or walker only if necessary. °Avoid sitting on soft sofas. ° °

## 2015-10-13 NOTE — Interval H&P Note (Signed)
History and Physical Interval Note:  10/13/2015 7:34 AM  Blake Luna  has presented today for surgery, with the diagnosis of HNP L5 - S1 on the right  The various methods of treatment have been discussed with the patient and family. After consideration of risks, benefits and other options for treatment, the patient has consented to  Procedure(s): MICRO LUMBAR DECOMPRESSION L5 - S1 ON THE RIGHT (Right) as a surgical intervention .  The patient's history has been reviewed, patient examined, no change in status, stable for surgery.  I have reviewed the patient's chart and labs.  Questions were answered to the patient's satisfaction.     [name]

## 2015-10-13 NOTE — Anesthesia Procedure Notes (Signed)
Procedure Name: Intubation Date/Time: 10/13/2015 10:47 AM Performed by: Thornell MuleSTUBBLEFIELD, Haruka Kowaleski G Pre-anesthesia Checklist: Patient identified, Emergency Drugs available, Suction available and Patient being monitored Patient Re-evaluated:Patient Re-evaluated prior to inductionOxygen Delivery Method: Circle System Utilized Preoxygenation: Pre-oxygenation with 100% oxygen Intubation Type: IV induction Ventilation: Mask ventilation without difficulty Laryngoscope Size: Miller and 3 Grade View: Grade I Tube type: Oral Tube size: 7.5 mm Number of attempts: 1 Airway Equipment and Method: Stylet and Oral airway Placement Confirmation: ETT inserted through vocal cords under direct vision,  positive ETCO2 and breath sounds checked- equal and bilateral Secured at: 22 cm Tube secured with: Tape Dental Injury: Teeth and Oropharynx as per pre-operative assessment

## 2015-10-13 NOTE — Op Note (Signed)
Blake Luna, Blake Luna      ACCOUNT NO.:  [id number]  MEDICAL RECORD NO.:  [id number]  LOCATION:  [facility]                         FACILITY:  [facility]  PHYSICIAN:  Jene Every, M.D.    DATE OF BIRTH:  [birth year]  DATE OF PROCEDURE:  10/13/2015 DATE OF DISCHARGE:                              OPERATIVE REPORT   PREOPERATIVE DIAGNOSIS:  Spinal stenosis, herniated nucleus pulposus at L5-S1, right.  POSTOPERATIVE DIAGNOSIS:  Spinal stenosis, herniated nucleus pulposus at L5-S1, right.  PROCEDURES PERFORMED: 1. Microlumbar decompression L5-S1, right. 2. Foraminotomy, L5-S1 right.  ANESTHESIA:  General.  ASSISTANT:  Lanna Poche, PA.  HISTORY:  This is a 34 year old with refractory S1 and L5 radiculopathy secondary large disk herniation, L5-S1, failing conservative treatment, neural tension sign, EHL, weakness, persistent pain, was indicated for lumbar decompression.  Decompressed the S1 nerve root.  Risk and benefits were discussed including bleeding, infection, damage to neurovascular structures, DVT, PE, anesthetic complications, suboptimal range of motion, need for fusion in the future, etc.  TECHNIQUE:  The patient in supine position, after induction of adequate general anesthesia 2 g Kefzol, he was placed prone on the [place] frame. All bony prominences were well padded.  Lumbar region was prepped and draped in usual sterile fashion.  Two 18-gauge spinal needle was utilized to localize the L5-S1 interspace confirmed with x-ray. Incision was made from spinous process of L5-S1, 2 cm in length. Subcutaneous tissue was dissected.  Electrocautery was utilized to achieve hemostasis.  The dorsolumbar fascia was divided in line with skin incision.  Paraspinous muscles were elevated at L5-S1.  McCullough retractor was placed.  Operating microscope was draped and brought on the surgical field.  In the interlaminar space, a straight micro curette was utilized to detach the  ligamentum flavum from the cephalad edge of S1.  Neuro patty placed beneath the ligamentum flavum.  I then performed a generous foraminotomy of S1.  Following this, we decompressed, we removed the ligamentum flavum from the interspace.  Identified the S1 nerve root which was compressed into the lateral recess protecting the S1 nerve root.  I then decompressed the lateral recess to the medial border of the pedicle with a 2 mm Kerrison.  We gently mobilized the S1 nerve root medially, was found to be adhesed onto the lateral recess to the disk herniation and an epidural venous plexus.  After gentle mobilization, identified the disk herniation.  Then we lysed epidural venous plexus.  I performed an annulotomy.  Copious portion of disk material was removed from the disk space with straight upbiting pituitary, further mobilized with a Woodson retractor and a nerve hook and an Epstein preserving the endplates.  Multiple fragments, subannular, and extruded were excised.  Then irrigated disk space with antibiotic irrigation, retrieved 2 additional small fragments.  A small foraminotomy of L5 was performed as well the L5 nerve was passed out the foramen.  There were some disk degeneration, disk space narrowing noted and therefore neural foraminal stenosis.  Confirmatory radiograph obtained.  There was 1 cm of excursion in the S1 nerve root medial pedicle without tension.  There was no residual disk herniation in the foramen L5-S1, the shoulder or the axilla of the root or beneath thecal sac.  No  evidence of CSF leakage or active bleeding was noted.  I therefore draped epidural fat over the S1 nerve root, removed the McCullough retractor, irrigated the paraspinous musculature.  I then closed the fascia with 1 Vicryl, subcu with 2-0, and skin with subcuticular Prolene.  Sterile dressing applied, placed supine on the hospital bed, extubated without difficulty, transported to the recovery room in  satisfactory condition.  The patient tolerated the procedure well.  No complications.  Assistant, Lanna PocheJacqueline Bissell, [place].  Minimal blood loss.     Jene EveryJeffrey Alaia Lordi, M.D.     Cordelia PenJB/MEDQ  D:  10/13/2015  T:  10/13/2015  Job:  [id number]

## 2015-10-14 DIAGNOSIS — M4806 Spinal stenosis, lumbar region: Secondary | ICD-10-CM | POA: Diagnosis not present

## 2015-10-14 NOTE — Progress Notes (Signed)
Patient is alert and oriented. VS stable. No s/s of acute distress. Tolerating diet well. 1 tablet needed for pain medication. Dressing is C/D/I. Reviewed discharge education, information and instructions. Patient states understanding and has no questions at this time. Removed IV and tolerated well, catheter intact.

## 2015-10-14 NOTE — Progress Notes (Signed)
Subjective: 1 Day Post-Op Procedure(s) (LRB): MICRO LUMBAR DECOMPRESSION L5 - S1 ON THE RIGHT, with specimen of l5-s1 (Right) Patient reports pain as 3 on 0-10 scale.    Objective: Vital signs in last 24 hours: Temp:  [97.8 F (36.6 C)-99 F (37.2 C)] 98.5 F (36.9 C) (12/09 0609) Pulse Rate:  [66-97] 77 (12/09 0609) Resp:  [11-18] 16 (12/09 0609) BP: (123-155)/(55-87) 133/61 mmHg (12/09 0609) SpO2:  [95 %-100 %] 97 % (12/09 0609) Weight:  [105.915 kg (233 lb 8 oz)] 105.915 kg (233 lb 8 oz) (12/08 0825)  Intake/Output from previous day: 12/08 0701 - 12/09 0700 In: 3020 [P.O.:1320; I.V.:1600; IV Piggyback:100] Out: 1074 [Urine:1054; Blood:20] Intake/Output this shift:    No results for input(s): HGB in the last 72 hours. No results for input(s): WBC, RBC, HCT, PLT in the last 72 hours. No results for input(s): NA, K, CL, CO2, BUN, CREATININE, GLUCOSE, CALCIUM in the last 72 hours. No results for input(s): LABPT, INR in the last 72 hours.  Neurologically intact Neurovascular intact Sensation intact distally Incision: dressing C/D/I No cellulitis present  Assessment/Plan: 1 Day Post-Op Procedure(s) (LRB): MICRO LUMBAR DECOMPRESSION L5 - S1 ON THE RIGHT, with specimen of l5-s1 (Right) Advance diet Discharge home with home health  Blake Luna C 10/14/2015, 7:07 AM

## 2015-10-14 NOTE — Evaluation (Signed)
Occupational Therapy Evaluation Patient Details Name: Blake ReachChristopher Luna MRN: [id number] DOB: [birth year] Today's Date: 10/14/2015    History of Present Illness s/p L5-S1 decompression   Clinical Impression   This 34 year old man was admitted for the above surgery. All education was completed. No further OT is needed at this time    Follow Up Recommendations  No OT follow up    Equipment Recommendations  None recommended by OT    Recommendations for Other Services       Precautions / Restrictions Precautions Precautions: Back Restrictions Weight Bearing Restrictions: No      Mobility Bed Mobility Overal bed mobility: Needs Assistance                Transfers Overall transfer level: Independent               General transfer comment: supervision; cued for sequence and pt return demonstrated    Balance                                            ADL Overall ADL's : Needs assistance/impaired                         Toilet Transfer: Modified Independent;Comfort height toilet             General ADL Comments: pt is able to perform ADLs with set up.  He can comfortably cross legs.  Pt has a regular commode at home but feels he will be OK--practiced on our comfort height.  He has a vanity next to it. Also discussed sitting backwards and using tank as needed. Reviewed ADLs and back precautions.  Pt verbalizes all understanding     Vision     Perception     Praxis      Pertinent Vitals/Pain Pain Assessment: 0-10 Pain Score: 4  Pain Location: back Pain Descriptors / Indicators: Sore Pain Intervention(s): Limited activity within patient's tolerance;Monitored during session;Premedicated before session;Repositioned;Ice applied     Hand Dominance     Extremity/Trunk Assessment Upper Extremity Assessment Upper Extremity Assessment: Overall WFL for tasks assessed           Communication  Communication Communication: No difficulties   Cognition Arousal/Alertness: Awake/alert Behavior During Therapy: WFL for tasks assessed/performed Overall Cognitive Status: Within Functional Limits for tasks assessed                     General Comments       Exercises       Shoulder Instructions      Home Living Family/patient expects to be discharged to:: Private residence Living Arrangements: Spouse/significant other                 Bathroom Shower/Tub: Producer, television/film/videoWalk-in shower   Bathroom Toilet: Standard     Home Equipment: None          Prior Functioning/Environment Level of Independence: Independent             OT Diagnosis: Acute pain   OT Problem List:     OT Treatment/Interventions:      OT Goals(Current goals can be found in the care plan section) Acute Rehab OT Goals OT Goal Formulation: All assessment and education complete, DC therapy  OT Frequency:     Barriers to D/C:  Co-evaluation              End of Session    Activity Tolerance: Patient tolerated treatment well Patient left:  (up in room)   Time: 1610-9604 OT Time Calculation (min): 10 min Charges:  OT General Charges $OT Visit: 1 Procedure OT Evaluation $Initial OT Evaluation Tier I: 1 Procedure G-Codes: OT G-codes **NOT FOR INPATIENT CLASS** Functional Assessment Tool Used: clinical observation and judgment Functional Limitation: Self care Self Care Current Status (V4098): At least 1 percent but less than 20 percent impaired, limited or restricted Self Care Goal Status (J1914): At least 1 percent but less than 20 percent impaired, limited or restricted Self Care Discharge Status [id number]): At least 1 percent but less than 20 percent impaired, limited or restricted  [facility] 10/14/2015, 9:11 AM  Blake Luna, OTR/L [phone] 10/14/2015

## 2017-12-27 ENCOUNTER — Ambulatory Visit: Payer: BLUE CROSS/BLUE SHIELD | Admitting: Internal Medicine

## 2018-02-04 ENCOUNTER — Ambulatory Visit (INDEPENDENT_AMBULATORY_CARE_PROVIDER_SITE_OTHER): Payer: BLUE CROSS/BLUE SHIELD

## 2018-02-04 ENCOUNTER — Encounter: Payer: Self-pay | Admitting: Internal Medicine

## 2018-02-04 ENCOUNTER — Ambulatory Visit: Payer: BLUE CROSS/BLUE SHIELD | Admitting: Internal Medicine

## 2018-02-04 ENCOUNTER — Other Ambulatory Visit (HOSPITAL_COMMUNITY)
Admission: RE | Admit: 2018-02-04 | Discharge: 2018-02-04 | Disposition: A | Discharge status: Home / Self Care | Payer: BLUE CROSS/BLUE SHIELD | Source: Ambulatory Visit | Attending: Internal Medicine | Admitting: Internal Medicine

## 2018-02-04 VITALS — BP 128/68 | HR 66 | Temp 98.4°F | Resp 16 | Ht 73.5 in | Wt 219.1 lb

## 2018-02-04 DIAGNOSIS — Z113 Encounter for screening for infections with a predominantly sexual mode of transmission: Secondary | ICD-10-CM | POA: Diagnosis not present

## 2018-02-04 DIAGNOSIS — J329 Chronic sinusitis, unspecified: Secondary | ICD-10-CM | POA: Diagnosis not present

## 2018-02-04 DIAGNOSIS — J40 Bronchitis, not specified as acute or chronic: Secondary | ICD-10-CM

## 2018-02-04 DIAGNOSIS — H6982 Other specified disorders of Eustachian tube, left ear: Secondary | ICD-10-CM | POA: Diagnosis not present

## 2018-02-04 DIAGNOSIS — J309 Allergic rhinitis, unspecified: Secondary | ICD-10-CM | POA: Insufficient documentation

## 2018-02-04 DIAGNOSIS — Z1389 Encounter for screening for other disorder: Secondary | ICD-10-CM | POA: Diagnosis not present

## 2018-02-04 DIAGNOSIS — R059 Cough, unspecified: Secondary | ICD-10-CM

## 2018-02-04 DIAGNOSIS — J069 Acute upper respiratory infection, unspecified: Secondary | ICD-10-CM

## 2018-02-04 DIAGNOSIS — R05 Cough: Secondary | ICD-10-CM | POA: Diagnosis not present

## 2018-02-04 DIAGNOSIS — H6992 Unspecified Eustachian tube disorder, left ear: Secondary | ICD-10-CM | POA: Insufficient documentation

## 2018-02-04 DIAGNOSIS — Z1329 Encounter for screening for other suspected endocrine disorder: Secondary | ICD-10-CM | POA: Diagnosis not present

## 2018-02-04 MED ORDER — METHYLPREDNISOLONE ACETATE 40 MG/ML IJ SUSP
40.0000 mg | Freq: Once | INTRAMUSCULAR | Status: AC
  Administered 2018-02-04: 40 mg via INTRAMUSCULAR

## 2018-02-04 MED ORDER — LEVOFLOXACIN 750 MG PO TABS: 750.0000 mg | ORAL_TABLET | Freq: Every day | ORAL | 0 refills | Status: DC

## 2018-02-04 MED ORDER — AZELASTINE HCL 0.1 % NA SOLN: 2.0000 | Freq: Every day | NASAL | 2 refills | Status: DC

## 2018-02-04 MED ORDER — ALBUTEROL SULFATE HFA 108 (90 BASE) MCG/ACT IN AERS: 1.0000 | INHALATION_SPRAY | Freq: Four times a day (QID) | RESPIRATORY_TRACT | 0 refills | Status: DC | PRN

## 2018-02-04 MED ORDER — FLUTICASONE PROPIONATE 50 MCG/ACT NA SUSP: 2.0000 | Freq: Every day | NASAL | 6 refills | Status: DC

## 2018-02-04 NOTE — Progress Notes (Signed)
Chief Complaint  Patient presents with  . Establish Care   New patient  1. He reports since 09/2017 he has been having cough, sinus issues and been to the urgent care in 09/2017 given Zpack which helped Tessalon perles which did not help, Allegra, Astelin which is no longer taking since Fall 2018. 12/2017 he ws given Augmentin for similar sx's which helped   Today he c/o left ear pain x months, dry cough now turning productive, nasal drainage with yellow/green and sometimes blood, reduced hearing esp left ear, He does have allergies to pollen, dust and has tried Claritin in the past not not currently using. He is using sinex OTC. He does report childhood asthma. No h/o fever. He does have a h/o asthma in childhood.  This time he has tried also OTC Mucinex. He reports the only link to him being ill is his daughter started Kindergarden the year and household members have been sick since   Agrees to STD testing today reports w/in the last 6 months slept with 2 male partners and came out to his wife.   Review of Systems  Constitutional: Negative for fever and weight loss.  HENT: Positive for congestion, ear pain and hearing loss.   Eyes: Negative for blurred vision.  Respiratory: Positive for cough. Negative for shortness of breath.   Cardiovascular: Negative for chest pain.  Gastrointestinal: Negative for heartburn.  Musculoskeletal: Negative for falls.  Skin: Negative for rash.  Neurological: Negative for headaches.  Psychiatric/Behavioral: Negative for depression.   Past Medical History:  Diagnosis Date  . Allergy   . Asthma    childhood  . Chicken pox   . HNP (herniated nucleus pulposus), lumbar    Past Surgical History:  Procedure Laterality Date  . BACK SURGERY     repair of ruptured disc L4/L5   . LUMBAR LAMINECTOMY/DECOMPRESSION MICRODISCECTOMY Right 10/13/2015   Procedure: MICRO LUMBAR DECOMPRESSION L5 - S1 ON THE RIGHT, with specimen of l5-s1;  Surgeon: Jene Every, MD;   Location: [facility];  Service: Orthopedics;  Laterality: Right;   Family History  Problem Relation Age of Onset  . Cancer Father        liver  . Diabetes Father   . Heart disease Father        chf  . Early death Father        age 43   . Hyperlipidemia Father   . Hypertension Father   . Stroke Father   . Alcohol abuse Sister   . Depression Sister   . Drug abuse Sister   . Arthritis Maternal Grandmother   . Cancer Maternal Grandmother   . Hyperlipidemia Maternal Grandmother   . Hypertension Maternal Grandmother   . Arthritis Maternal Grandfather   . Heart disease Maternal Grandfather   . Hyperlipidemia Maternal Grandfather   . Hypertension Maternal Grandfather   . Arthritis Paternal Grandfather   . Depression Paternal Grandfather   . Hyperlipidemia Paternal Grandfather   . Heart disease Paternal Grandfather   . Hypertension Paternal Grandfather   . Stroke Paternal Grandfather    Social History   Socioeconomic History  . Marital status: Married    Spouse name: Not on file  . Number of children: Not on file  . Years of education: Not on file  . Highest education level: Not on file  Occupational History  . Not on file  Social Needs  . Financial resource strain: Not on file  . Food insecurity:    Worry: Not on  file    Inability: Not on file  . Transportation needs:    Medical: Not on file    Non-medical: Not on file  Tobacco Use  . Smoking status: Never Smoker  . Smokeless tobacco: Never Used  Substance and Sexual Activity  . Alcohol use: Yes    Comment: occasional wine  . Drug use: No  . Sexual activity: Yes  Lifestyle  . Physical activity:    Days per week: Not on file    Minutes per session: Not on file  . Stress: Not on file  Relationships  . Social connections:    Talks on phone: Not on file    Gets together: Not on file    Attends religious service: Not on file    Active member of club or organization: Not on file    Attends meetings of clubs or  organizations: Not on file    Relationship status: Not on file  . Intimate partner violence:    Fear of current or ex partner: Not on file    Emotionally abused: Not on file    Physically abused: Not on file    Forced sexual activity: Not on file  Other Topics Concern  . 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 albuterol (PROVENTIL HFA;VENTOLIN HFA) 108 (90 Base) MCG/ACT inhaler Inhale 1-2 puffs into the lungs every 6 (six) hours as needed for wheezing or shortness of breath.  . [DISCONTINUED] albuterol (PROVENTIL HFA;VENTOLIN HFA) 108 (90 BASE) MCG/ACT inhaler Inhale 2 puffs into the lungs every 6 (six) hours as needed for wheezing or shortness of breath.   Allergies  Allergen Reactions  . Amoxicillin Other (See Comments)    Upset stomach   No results found for this or any previous visit (from the past 2160 hour(s)). Objective  Body mass index is 28.52 kg/m. Wt Readings from Last 3 Encounters:  02/04/18 219 lb 2 oz (99.4 kg)  10/13/15 233 lb 8 oz (105.9 kg)  10/06/15 233 lb 8 oz (105.9 kg)   Temp Readings from Last 3 Encounters:  02/04/18 98.4 F (36.9 C) (Oral)  10/14/15 97.4 F (36.3 C) (Oral)  10/06/15 98.3 F (36.8 C) (Oral)   BP Readings from Last 3 Encounters:  02/04/18 128/68  10/14/15 136/65  10/06/15 (!) 144/69   Pulse Readings from Last 3 Encounters:  02/04/18 66  10/14/15 64  10/06/15 67    Physical Exam  Constitutional: He is oriented to person, place, and time and well-developed, well-nourished, and in no distress. Vital signs are normal.  HENT:  Head: Normocephalic and atraumatic.  Mouth/Throat: Oropharynx is clear and moist and mucous membranes are normal.  Redness in b/l ears and fluid in b/l ears   Eyes: Pupils are equal, round, and reactive to light. Conjunctivae are normal.   Cardiovascular: Normal rate, regular rhythm and normal heart sounds.  Pulmonary/Chest: Effort normal.  Course breath sounds b/l   Neurological: He is alert and oriented to person, place, and time. Gait normal. Gait normal.  Skin: Skin is warm, dry and intact.  Psychiatric: Mood, memory, affect and judgment normal.  Nursing note and vitals reviewed.   Assessment   1. Bronchitis/sinusitis/otalgia L could be 2/2 eustachian tube dysfunction with h/o allergic rhinitis as well  2. HM  Plan   1. Levaquin 750 mg x 1 week, Depo 40 x 1 today, prn Albuterol  rec use OTC antihistamine, mucinex, flonase and Astelin  CXR today  F/u in 2 weeks  2.  Did not have flu shot  Ask about Tdap at f/u   STD check today see HPI, with other labs CMET, CBC, TSH, T4, UA, hep B. Will not check vit D Check lipid in future  [place] in [place] 2018  [facility] [place]    Provider: Dr. French Anaracy McLean-Scocuzza-Internal Medicine

## 2018-02-04 NOTE — Patient Instructions (Signed)
F/u in 2 weeks  Upper Respiratory Infection, Adult Most upper respiratory infections (URIs) are caused by a virus. A URI affects the nose, throat, and upper air passages. The most common type of URI is often called "the common cold." Follow these instructions at home:  Take medicines only as told by your doctor.  Gargle warm saltwater or take cough drops to comfort your throat as told by your doctor.  Use a warm mist humidifier or inhale steam from a shower to increase air moisture. This may make it easier to breathe.  Drink enough fluid to keep your pee (urine) clear or pale yellow.  Eat soups and other clear broths.  Have a healthy diet.  Rest as needed.  Go back to work when your fever is gone or your doctor says it is okay. ? You may need to stay home longer to avoid giving your URI to others. ? You can also wear a face mask and wash your hands often to prevent spread of the virus.  Use your inhaler more if you have asthma.  Do not use any tobacco products, including cigarettes, chewing tobacco, or electronic cigarettes. If you need help quitting, ask your doctor. Contact a doctor if:  You are getting worse, not better.  Your symptoms are not helped by medicine.  You have chills.  You are getting more short of breath.  You have brown or red mucus.  You have yellow or brown discharge from your nose.  You have pain in your face, especially when you bend forward.  You have a fever.  You have puffy (swollen) neck glands.  You have pain while swallowing.  You have white areas in the back of your throat. Get help right away if:  You have very bad or constant: ? Headache. ? Ear pain. ? Pain in your forehead, behind your eyes, and over your cheekbones (sinus pain). ? Chest pain.  You have long-lasting (chronic) lung disease and any of the following: ? Wheezing. ? Long-lasting cough. ? Coughing up blood. ? A change in your usual mucus.  You have a stiff  neck.  You have changes in your: ? Vision. ? Hearing. ? Thinking. ? Mood. This information is not intended to replace advice given to you by your health care provider. Make sure you discuss any questions you have with your health care provider. Document Released: 04/09/2008 Document Revised: 06/24/2016 Document Reviewed: 01/27/2014 Elsevier Interactive Patient Education  2018 [employer]

## 2018-02-05 LAB — URINALYSIS, ROUTINE W REFLEX MICROSCOPIC
BILIRUBIN URINE: NEGATIVE
HGB URINE DIPSTICK: NEGATIVE
Leukocytes, UA: NEGATIVE
NITRITE: NEGATIVE
RBC / HPF: NONE SEEN (ref 0–?)
Specific Gravity, Urine: 1.025 (ref 1.000–1.030)
Total Protein, Urine: NEGATIVE
Urine Glucose: NEGATIVE
Urobilinogen, UA: 0.2 (ref 0.0–1.0)
pH: 6 (ref 5.0–8.0)

## 2018-02-05 LAB — CBC WITH DIFFERENTIAL/PLATELET
BASOS PCT: 1.2 % (ref 0.0–3.0)
Basophils Absolute: 0.1 10*3/uL (ref 0.0–0.1)
EOS PCT: 7 % — AB (ref 0.0–5.0)
Eosinophils Absolute: 0.6 10*3/uL (ref 0.0–0.7)
HEMATOCRIT: 39.4 % (ref 39.0–52.0)
HEMOGLOBIN: 13.4 g/dL (ref 13.0–17.0)
Lymphocytes Relative: 22.6 % (ref 12.0–46.0)
Lymphs Abs: 2 10*3/uL (ref 0.7–4.0)
MCHC: 33.9 g/dL (ref 30.0–36.0)
MCV: 86.5 fl (ref 78.0–100.0)
MONO ABS: 0.6 10*3/uL (ref 0.1–1.0)
MONOS PCT: 6.9 % (ref 3.0–12.0)
Neutro Abs: 5.4 10*3/uL (ref 1.4–7.7)
Neutrophils Relative %: 62.3 % (ref 43.0–77.0)
Platelets: 231 10*3/uL (ref 150.0–400.0)
RBC: 4.56 Mil/uL (ref 4.22–5.81)
RDW: 13.8 % (ref 11.5–15.5)
WBC: 8.8 10*3/uL (ref 4.0–10.5)

## 2018-02-05 LAB — COMPREHENSIVE METABOLIC PANEL
ALT: 29 U/L (ref 0–53)
AST: 23 U/L (ref 0–37)
Albumin: 4.2 g/dL (ref 3.5–5.2)
Alkaline Phosphatase: 55 U/L (ref 39–117)
BUN: 15 mg/dL (ref 6–23)
CALCIUM: 9.5 mg/dL (ref 8.4–10.5)
CO2: 27 meq/L (ref 19–32)
Chloride: 102 mEq/L (ref 96–112)
Creatinine, Ser: 0.99 mg/dL (ref 0.40–1.50)
GFR: 90.44 mL/min (ref >60.00)
GLUCOSE: 87 mg/dL (ref 70–99)
Potassium: 3.9 mEq/L (ref 3.5–5.1)
Sodium: 137 mEq/L (ref 135–145)
TOTAL PROTEIN: 7.2 g/dL (ref 6.0–8.3)
Total Bilirubin: 0.3 mg/dL (ref 0.2–1.2)

## 2018-02-05 LAB — HEPATITIS B SURFACE ANTIGEN: HEP B S AG: NONREACTIVE

## 2018-02-05 LAB — T4, FREE: Free T4: 0.77 ng/dL (ref 0.60–1.60)

## 2018-02-05 LAB — HEPATITIS B SURFACE ANTIBODY, QUANTITATIVE: HEPATITIS B-POST: 14 m[IU]/mL (ref >=10)

## 2018-02-05 LAB — HSV 2 ANTIBODY, IGG: HSV 2 Glycoprotein G Ab, IgG: <0.9 index

## 2018-02-05 LAB — HEPATITIS C ANTIBODY
Hepatitis C Ab: NONREACTIVE
SIGNAL TO CUT-OFF: 0.02 (ref <1.00)

## 2018-02-05 LAB — RPR: RPR Ser Ql: NONREACTIVE

## 2018-02-05 LAB — HIV ANTIBODY (ROUTINE TESTING W REFLEX): HIV: NONREACTIVE

## 2018-02-05 LAB — TSH: TSH: 1.24 u[IU]/mL (ref 0.35–4.50)

## 2018-02-05 LAB — HSV 1 ANTIBODY, IGG: HSV 1 Glycoprotein G Ab, IgG: 2.62 index — ABNORMAL HIGH

## 2018-02-06 LAB — URINE CYTOLOGY ANCILLARY ONLY
CHLAMYDIA, DNA PROBE: NEGATIVE
Neisseria Gonorrhea: NEGATIVE
Trichomonas: NEGATIVE

## 2018-02-18 ENCOUNTER — Ambulatory Visit: Payer: BLUE CROSS/BLUE SHIELD | Admitting: Internal Medicine

## 2018-02-18 ENCOUNTER — Encounter: Payer: Self-pay | Admitting: Internal Medicine

## 2018-02-18 VITALS — BP 118/68 | HR 87 | Temp 98.8°F | Ht 73.5 in | Wt 216.4 lb

## 2018-02-18 DIAGNOSIS — Z13220 Encounter for screening for lipoid disorders: Secondary | ICD-10-CM

## 2018-02-18 DIAGNOSIS — Z23 Encounter for immunization: Secondary | ICD-10-CM

## 2018-02-18 DIAGNOSIS — Z0000 Encounter for general adult medical examination without abnormal findings: Secondary | ICD-10-CM | POA: Diagnosis not present

## 2018-02-18 LAB — LIPID PANEL
Cholesterol: 162 mg/dL (ref 0–200)
HDL: 56.5 mg/dL (ref >39.00)
LDL CALC: 92 mg/dL (ref 0–99)
NonHDL: 105.89
TRIGLYCERIDES: 71 mg/dL (ref 0.0–149.0)
Total CHOL/HDL Ratio: 3
VLDL: 14.2 mg/dL (ref 0.0–40.0)

## 2018-02-18 NOTE — Progress Notes (Signed)
Chief Complaint  Patient presents with  . Follow-up   Annual exam today   F/u feeling better  Reviewed labs  Overweight pt goal wt is 185/190 trying to exercise with personal trainer to lose wt 3x per day    Review of Systems  Constitutional: Positive for weight loss.       Trying for wt loss  HENT: Negative for hearing loss.   Eyes: Negative for blurred vision.  Respiratory: Negative for cough and shortness of breath.   Cardiovascular: Negative for chest pain.  Gastrointestinal: Negative for abdominal pain.  Musculoskeletal: Negative for falls.  Skin: Negative for rash.  Neurological: Negative for headaches.  Endo/Heme/Allergies: Positive for environmental allergies.  Psychiatric/Behavioral: Negative for depression.   Past Medical History:  Diagnosis Date  . Allergy   . Asthma    childhood  . Chicken pox   . HNP (herniated nucleus pulposus), lumbar    Past Surgical History:  Procedure Laterality Date  . BACK SURGERY     repair of ruptured disc L4/L5   . LUMBAR LAMINECTOMY/DECOMPRESSION MICRODISCECTOMY Right 10/13/2015   Procedure: MICRO LUMBAR DECOMPRESSION L5 - S1 ON THE RIGHT, with specimen of l5-s1;  Surgeon: Jene Every, MD;  Location: [facility];  Service: Orthopedics;  Laterality: Right;   Family History  Problem Relation Age of Onset  . Cancer Father        liver  . Diabetes Father   . Heart disease Father        chf  . Early death Father        age 40   . Hyperlipidemia Father   . Hypertension Father   . Stroke Father   . Alcohol abuse Sister   . Depression Sister   . Drug abuse Sister   . Arthritis Maternal Grandmother   . Cancer Maternal Grandmother   . Hyperlipidemia Maternal Grandmother   . Hypertension Maternal Grandmother   . Arthritis Maternal Grandfather   . Heart disease Maternal Grandfather   . Hyperlipidemia Maternal Grandfather   . Hypertension Maternal Grandfather   . Arthritis Paternal Grandfather   . Depression Paternal Grandfather    . Hyperlipidemia Paternal Grandfather   . Heart disease Paternal Grandfather   . Hypertension Paternal Grandfather   . Stroke Paternal Grandfather    Social History   Socioeconomic History  . Marital status: Married    Spouse name: Not on file  . Number of children: Not on file  . Years of education: Not on file  . Highest education level: Not on file  Occupational History  . Not on file  Social Needs  . Financial resource strain: Not on file  . Food insecurity:    Worry: Not on file    Inability: Not on file  . Transportation needs:    Medical: Not on file    Non-medical: Not on file  Tobacco Use  . Smoking status: Never Smoker  . Smokeless tobacco: Never Used  Substance and Sexual Activity  . Alcohol use: Yes    Comment: occasional wine  . Drug use: No  . Sexual activity: Yes  Lifestyle  . Physical activity:    Days per week: Not on file    Minutes per session: Not on file  . Stress: Not on file  Relationships  . Social connections:    Talks on phone: Not on file    Gets together: Not on file    Attends religious service: Not on file    Active member of  club or organization: Not on file    Attends meetings of clubs or organizations: Not on file    Relationship status: Not on file  . Intimate partner violence:    Fear of current or ex partner: Not on file    Emotionally abused: Not on file    Physically abused: Not on file    Forced sexual activity: Not on file  Other Topics Concern  . 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 albuterol (PROVENTIL HFA;VENTOLIN HFA) 108 (90 Base) MCG/ACT inhaler Inhale 1-2 puffs into the lungs every 6 (six) hours as needed for wheezing or shortness of breath.  Marland Kitchen azelastine (ASTELIN) 0.1 % nasal spray Place 2 sprays into both  nostrils daily. Use in each nostril as directed  . fluticasone (FLONASE) 50 MCG/ACT nasal spray Place 2 sprays into both nostrils daily.  Marland Kitchen loratadine (CLARITIN) 10 MG tablet Take 10 mg by mouth daily.   Allergies  Allergen Reactions  . Amoxicillin Other (See Comments)    Upset stomach   Recent Results (from the past 2160 hour(s))  Urine cytology ancillary only     Status: None   Collection Time: 02/04/18 12:00 AM  Result Value Ref Range   Chlamydia Negative     Comment: Normal Reference Range - Negative   Neisseria gonorrhea Negative     Comment: Normal Reference Range - Negative   Trichomonas Negative     Comment: Normal Reference Range - Negative  Comprehensive metabolic panel     Status: None   Collection Time: 02/04/18  3:18 PM  Result Value Ref Range   Sodium 137 135 - 145 mEq/L   Potassium 3.9 3.5 - 5.1 mEq/L   Chloride 102 96 - 112 mEq/L   CO2 27 19 - 32 mEq/L   Glucose, Bld 87 70 - 99 mg/dL   BUN 15 6 - 23 mg/dL   Creatinine, Ser 1.61 0.40 - 1.50 mg/dL   Total Bilirubin 0.3 0.2 - 1.2 mg/dL   Alkaline Phosphatase 55 39 - 117 U/L   AST 23 0 - 37 U/L   ALT 29 0 - 53 U/L   Total Protein 7.2 6.0 - 8.3 g/dL   Albumin 4.2 3.5 - 5.2 g/dL   Calcium 9.5 8.4 - 09.6 mg/dL   GFR 04.54 >09.81 mL/min  CBC with Differential/Platelet     Status: Abnormal   Collection Time: 02/04/18  3:18 PM  Result Value Ref Range   WBC 8.8 4.0 - 10.5 K/uL   RBC 4.56 4.22 - 5.81 Mil/uL   Hemoglobin 13.4 13.0 - 17.0 g/dL   HCT 19.1 47.8 - 29.5 %   MCV 86.5 78.0 - 100.0 fl   MCHC 33.9 30.0 - 36.0 g/dL   RDW 62.1 30.8 - 65.7 %   Platelets 231.0 150.0 - 400.0 K/uL   Neutrophils Relative % 62.3 43.0 - 77.0 %   Lymphocytes Relative 22.6 12.0 - 46.0 %   Monocytes Relative 6.9 3.0 - 12.0 %   Eosinophils Relative 7.0 (H) 0.0 - 5.0 %   Basophils Relative 1.2 0.0 - 3.0 %   Neutro Abs 5.4 1.4 - 7.7 K/uL   Lymphs Abs 2.0 0.7 - 4.0 K/uL  Monocytes Absolute 0.6 0.1 - 1.0 K/uL   Eosinophils Absolute  0.6 0.0 - 0.7 K/uL   Basophils Absolute 0.1 0.0 - 0.1 K/uL  T4, free     Status: None   Collection Time: 02/04/18  3:18 PM  Result Value Ref Range   Free T4 0.77 0.60 - 1.60 ng/dL    Comment: Specimens from patients who are undergoing biotin therapy and /or ingesting biotin supplements may contain high levels of biotin.  The higher biotin concentration in these specimens interferes with this Free T4 assay.  Specimens that contain high levels  of biotin may cause false high results for this Free T4 assay.  Please interpret results in light of the total clinical presentation of the patient.    TSH     Status: None   Collection Time: 02/04/18  3:18 PM  Result Value Ref Range   TSH 1.24 0.35 - 4.50 uIU/mL  Urinalysis, Routine w reflex microscopic     Status: Abnormal   Collection Time: 02/04/18  3:18 PM  Result Value Ref Range   Color, Urine YELLOW Yellow;Lt. Yellow   APPearance CLEAR Clear   Specific Gravity, Urine 1.025 1.000 - 1.030   pH 6.0 5.0 - 8.0   Total Protein, Urine NEGATIVE Negative   Urine Glucose NEGATIVE Negative   Ketones, ur TRACE (A) Negative   Bilirubin Urine NEGATIVE Negative   Hgb urine dipstick NEGATIVE Negative   Urobilinogen, UA 0.2 0.0 - 1.0   Leukocytes, UA NEGATIVE Negative   Nitrite NEGATIVE Negative   WBC, UA 0-2/hpf 0-2/hpf   RBC / HPF none seen 0-2/hpf   Squamous Epithelial / LPF Rare(0-4/hpf) Rare(0-4/hpf)  Hepatitis B surface antibody     Status: None   Collection Time: 02/04/18  3:18 PM  Result Value Ref Range   Hepatitis B-Post 14 > OR = 10 mIU/mL    Comment: . Patient has immunity to hepatitis B virus. . For additional information, please refer to http://education.[facility].com/faq/FAQ105 (This link is being provided for informational/ educational purposes only).   HIV antibody (with reflex)     Status: None   Collection Time: 02/04/18  3:18 PM  Result Value Ref Range   HIV 1&2 Ab, 4th Generation NON-REACTIVE NON-REACTI    Comment:  HIV-1 antigen and HIV-1/HIV-2 antibodies were not detected. There is no laboratory evidence of HIV infection. Marland Kitchen. PLEASE NOTE: This information has been disclosed to you from records whose confidentiality may be protected by state law.  If your state requires such protection, then the state law prohibits you from making any further disclosure of the information without the specific written consent of the person to whom it pertains, or as otherwise permitted by law. A general authorization for the release of medical or other information is NOT sufficient for this purpose. . For additional information please refer to http://education.[facility].com/faq/FAQ106 (This link is being provided for informational/ educational purposes only.) . Marland Kitchen. The performance of this assay has not been clinically validated in patients less than 148 years old. .   Hepatitis B surface antigen     Status: None   Collection Time: 02/04/18  3:18 PM  Result Value Ref Range   Hepatitis B Surface Ag NON-REACTIVE NON-REACTI  RPR     Status: None   Collection Time: 02/04/18  3:18 PM  Result Value Ref Range   RPR Ser Ql NON-REACTIVE NON-REACTI  Hepatitis C antibody     Status: None   Collection Time: 02/04/18  3:22 PM  Result Value  Ref Range   Hepatitis C Ab NON-REACTIVE NON-REACTI   SIGNAL TO CUT-OFF 0.02 <1.00    Comment: . HCV antibody was non-reactive. There is no laboratory  evidence of HCV infection. . In most cases, no further action is required. However, if recent HCV exposure is suspected, a test for HCV RNA (test code 16109) is suggested. . For additional information please refer to http://education.[facility].com/faq/FAQ22v1 (This link is being provided for informational/ educational purposes only.) .   HSV 1 antibody, IgG     Status: Abnormal   Collection Time: 02/04/18  3:22 PM  Result Value Ref Range   HSV 1 Glycoprotein G Ab, IgG 2.62 (H) index    Comment:                            Index          Interpretation                           -----          --------------                           <0.90          Negative                           0.90-1.09      Equivocal                           >1.09          Positive . This assay utilizes recombinant type-specific antigens to differentiate HSV-1 from HSV-2 infections. A positive result cannot distinguish between recent and past infection. If recent HSV infection is suspected but the results are negative or equivocal, the assay should be repeated in 4-6 weeks. The performance characteristics of the assay have not been established for pediatric populations, immunocompromised patients, or neonatal screening.   HSV 2 antibody, IgG     Status: None   Collection Time: 02/04/18  3:22 PM  Result Value Ref Range   HSV 2 Glycoprotein G Ab, IgG <0.90 index    Comment:                           Index          Interpretation                           -----          --------------                           <0.90          Negative                           0.90-1.09      Equivocal                           >1.09          Positive . This assay utilizes recombinant type-specific antigens to differentiate HSV-1 from HSV-2 infections. A positive result cannot  distinguish between recent and past infection. If recent HSV infection is suspected but the results are negative or equivocal, the assay should be repeated in 4-6 weeks. The performance characteristics of the assay have not been established for pediatric populations, immunocompromised patients, or neonatal screening.    Objective  Body mass index is 28.16 kg/m. Wt Readings from Last 3 Encounters:  02/18/18 216 lb 6.4 oz (98.2 kg)  02/04/18 219 lb 2 oz (99.4 kg)  10/13/15 233 lb 8 oz (105.9 kg)   Temp Readings from Last 3 Encounters:  02/18/18 98.8 F (37.1 C) (Oral)  02/04/18 98.4 F (36.9 C) (Oral)  10/14/15 97.4 F (36.3 C) (Oral)   BP Readings from  Last 3 Encounters:  02/18/18 118/68  02/04/18 128/68  10/14/15 136/65   Pulse Readings from Last 3 Encounters:  02/18/18 87  02/04/18 66  10/14/15 64    Physical Exam  Constitutional: He is oriented to person, place, and time. Vital signs are normal. He appears well-developed and well-nourished.  HENT:  Head: Normocephalic and atraumatic.  Mouth/Throat: Oropharynx is clear and moist and mucous membranes are normal.  Eyes: Pupils are equal, round, and reactive to light. Conjunctivae are normal.  Cardiovascular: Normal rate, regular rhythm and normal heart sounds.  Pulmonary/Chest: Effort normal and breath sounds normal.  Abdominal: Soft. Normal appearance.  Neurological: He is alert and oriented to person, place, and time. Gait normal.  Skin: Skin is warm, dry and intact.  Psychiatric: He has a normal mood and affect. His speech is normal and behavior is normal. Judgment and thought content normal. Cognition and memory are normal.  Nursing note and vitals reviewed.   Assessment   1. Annual exam  2. Overweight BMI 28.16  3.HM Plan  1.  Reviewed labs  rec continue exercise and healthy diet choices  2.  Cont exercise to lose wt 3x per week goal 185-190  3.  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 French Ana McLean-Scocuzza-Internal Medicine

## 2018-02-18 NOTE — Patient Instructions (Signed)
Keep up the good work goal weight 185-190 labs    Exercising to Lose Weight Exercising can help you to lose weight. In order to lose weight through exercise, you need to do vigorous-intensity exercise. You can tell that you are exercising with vigorous intensity if you are breathing very hard and fast and cannot hold a conversation while exercising. Moderate-intensity exercise helps to maintain your current weight. You can tell that you are exercising at a moderate level if you have a higher heart rate and faster breathing, but you are still able to hold a conversation. How often should I exercise? Choose an activity that you enjoy and set realistic goals. Your health care provider can help you to make an activity plan that works for you. Exercise regularly as directed by your health care provider. This may include:  Doing resistance training twice each week, such as: ? Push-ups. ? Sit-ups. ? Lifting weights. ? Using resistance bands.  Doing a given intensity of exercise for a given amount of time. Choose from these options: ? 150 minutes of moderate-intensity exercise every week. ? 75 minutes of vigorous-intensity exercise every week. ? A mix of moderate-intensity and vigorous-intensity exercise every week.  Children, pregnant women, people who are out of shape, people who are overweight, and older adults may need to consult a health care provider for individual recommendations. If you have any sort of medical condition, be sure to consult your health care provider before starting a new exercise program. What are some activities that can help me to lose weight?  Walking at a rate of at least 4.5 miles an hour.  Jogging or running at a rate of 5 miles per hour.  Biking at a rate of at least 10 miles per hour.  Lap swimming.  Roller-skating or in-line skating.  Cross-country skiing.  Vigorous competitive sports, such as football, basketball, and soccer.  Jumping rope.  Aerobic  dancing. How can I be more active in my day-to-day activities?  Use the stairs instead of the elevator.  Take a walk during your lunch break.  If you drive, park your car farther away from work or school.  If you take public transportation, get off one stop early and walk the rest of the way.  Make all of your phone calls while standing up and walking around.  Get up, stretch, and walk around every 30 minutes throughout the day. What guidelines should I follow while exercising?  Do not exercise so much that you hurt yourself, feel dizzy, or get very short of breath.  Consult your health care provider prior to starting a new exercise program.  Wear comfortable clothes and shoes with good support.  Drink plenty of water while you exercise to prevent dehydration or heat stroke. Body water is lost during exercise and must be replaced.  Work out until you breathe faster and your heart beats faster. This information is not intended to replace advice given to you by your health care provider. Make sure you discuss any questions you have with your health care provider. Document Released: 11/24/2010 Document Revised: 03/29/2016 Document Reviewed: 03/25/2014 Elsevier Interactive Patient Education  [employer]2018 Elsevier Inc.

## 2018-02-18 NOTE — Progress Notes (Signed)
Pre visit review using our clinic review tool, if applicable. No additional management support is needed unless otherwise documented below in the visit note. 

## 2019-02-17 ENCOUNTER — Telehealth: Payer: Self-pay

## 2019-02-17 NOTE — Telephone Encounter (Signed)
Copied from CRM [id number]. Topic: General - Other >> Feb 17, 2019 12:25 PM Lorrine Kin, NT wrote: Reason for CRM: Patient returning a call to [facility] regarding his CPE appointment. Patient states that he will call back in a few months to reschedule this appointment.

## 2019-02-19 ENCOUNTER — Encounter: Payer: BLUE CROSS/BLUE SHIELD | Admitting: Internal Medicine

## 2019-07-14 ENCOUNTER — Other Ambulatory Visit: Payer: Self-pay

## 2019-07-14 DIAGNOSIS — Z20822 Contact with and (suspected) exposure to covid-19: Secondary | ICD-10-CM

## 2019-07-16 LAB — NOVEL CORONAVIRUS, NAA: SARS-CoV-2, NAA: NOT DETECTED

## 2019-08-02 IMAGING — DX DG CHEST 2V
2 series · 2 of 2 positions shown · non-contrast
Comparison: None.

CLINICAL DATA: cough on/off since [DATE]

EXAM:
CHEST - 2 VIEW

[chest pa]
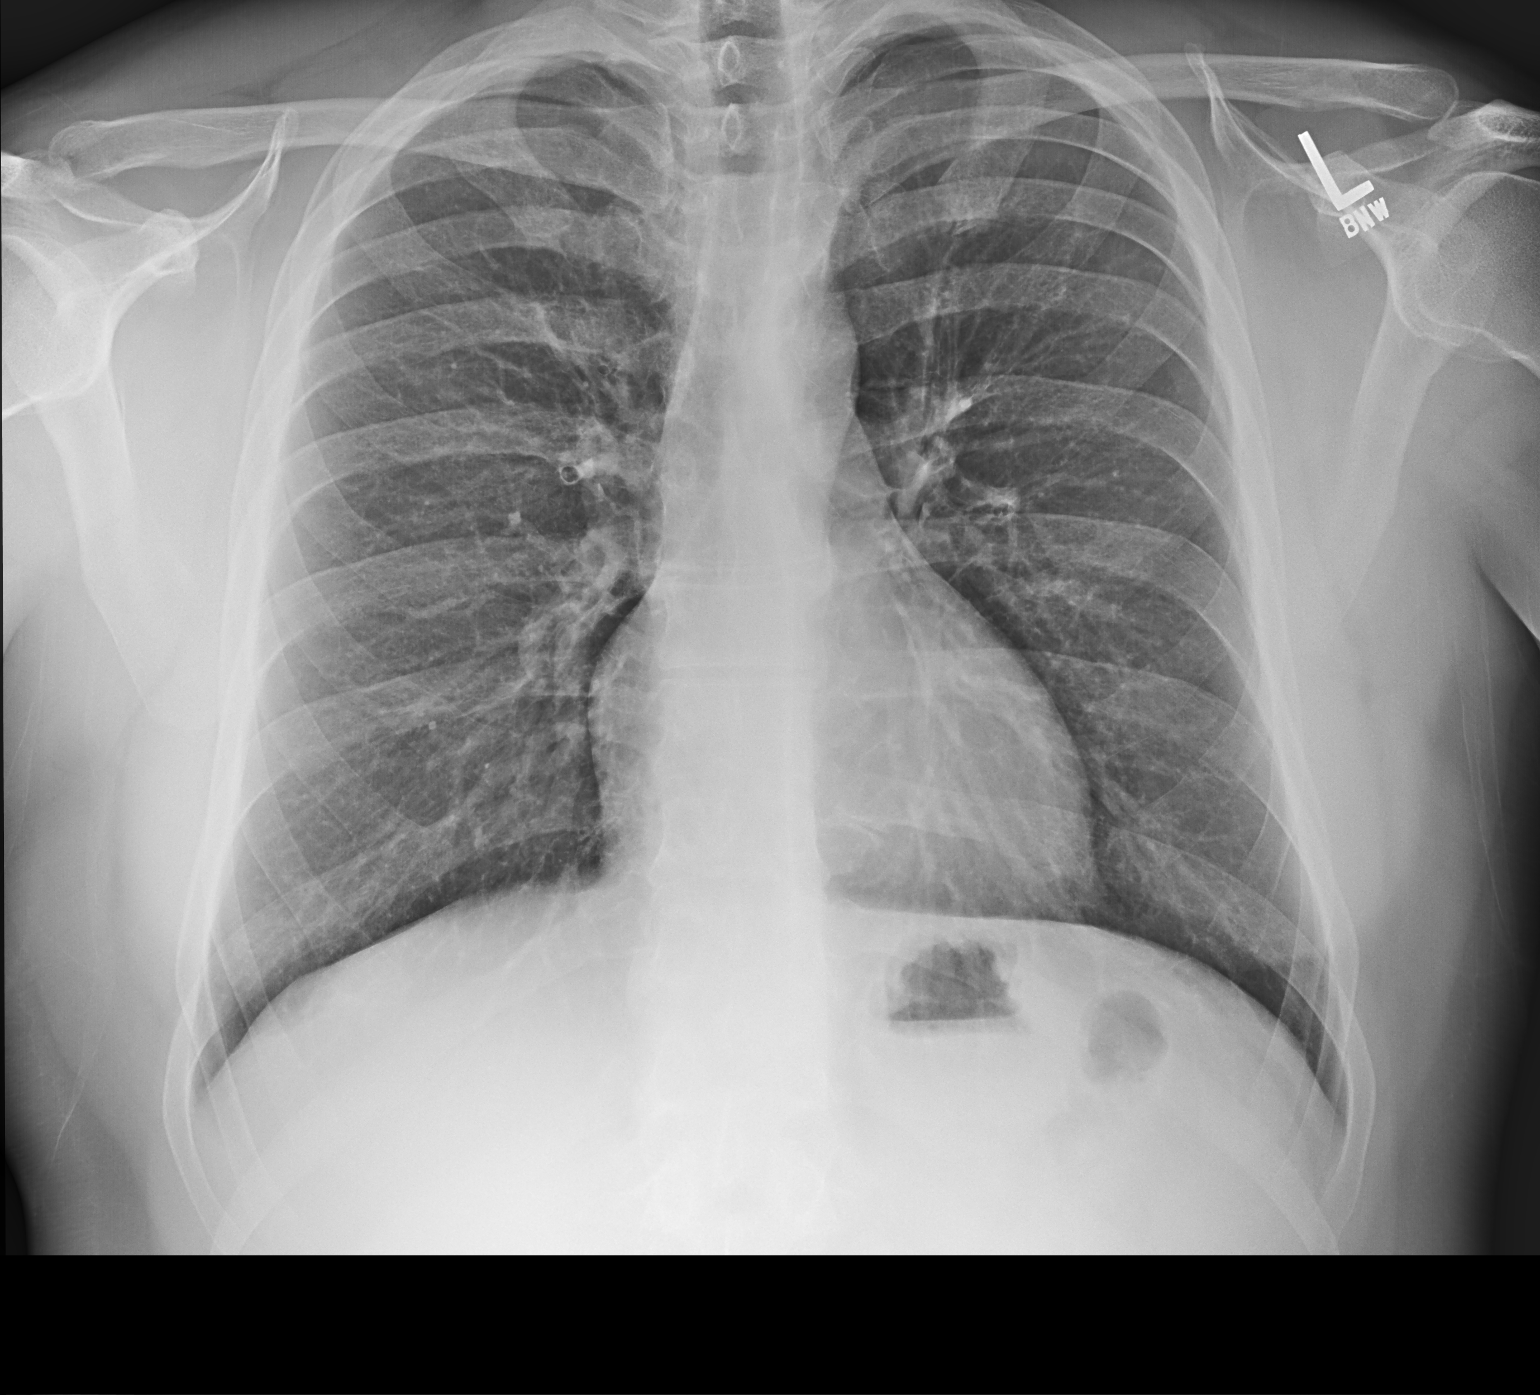

[chest lat]
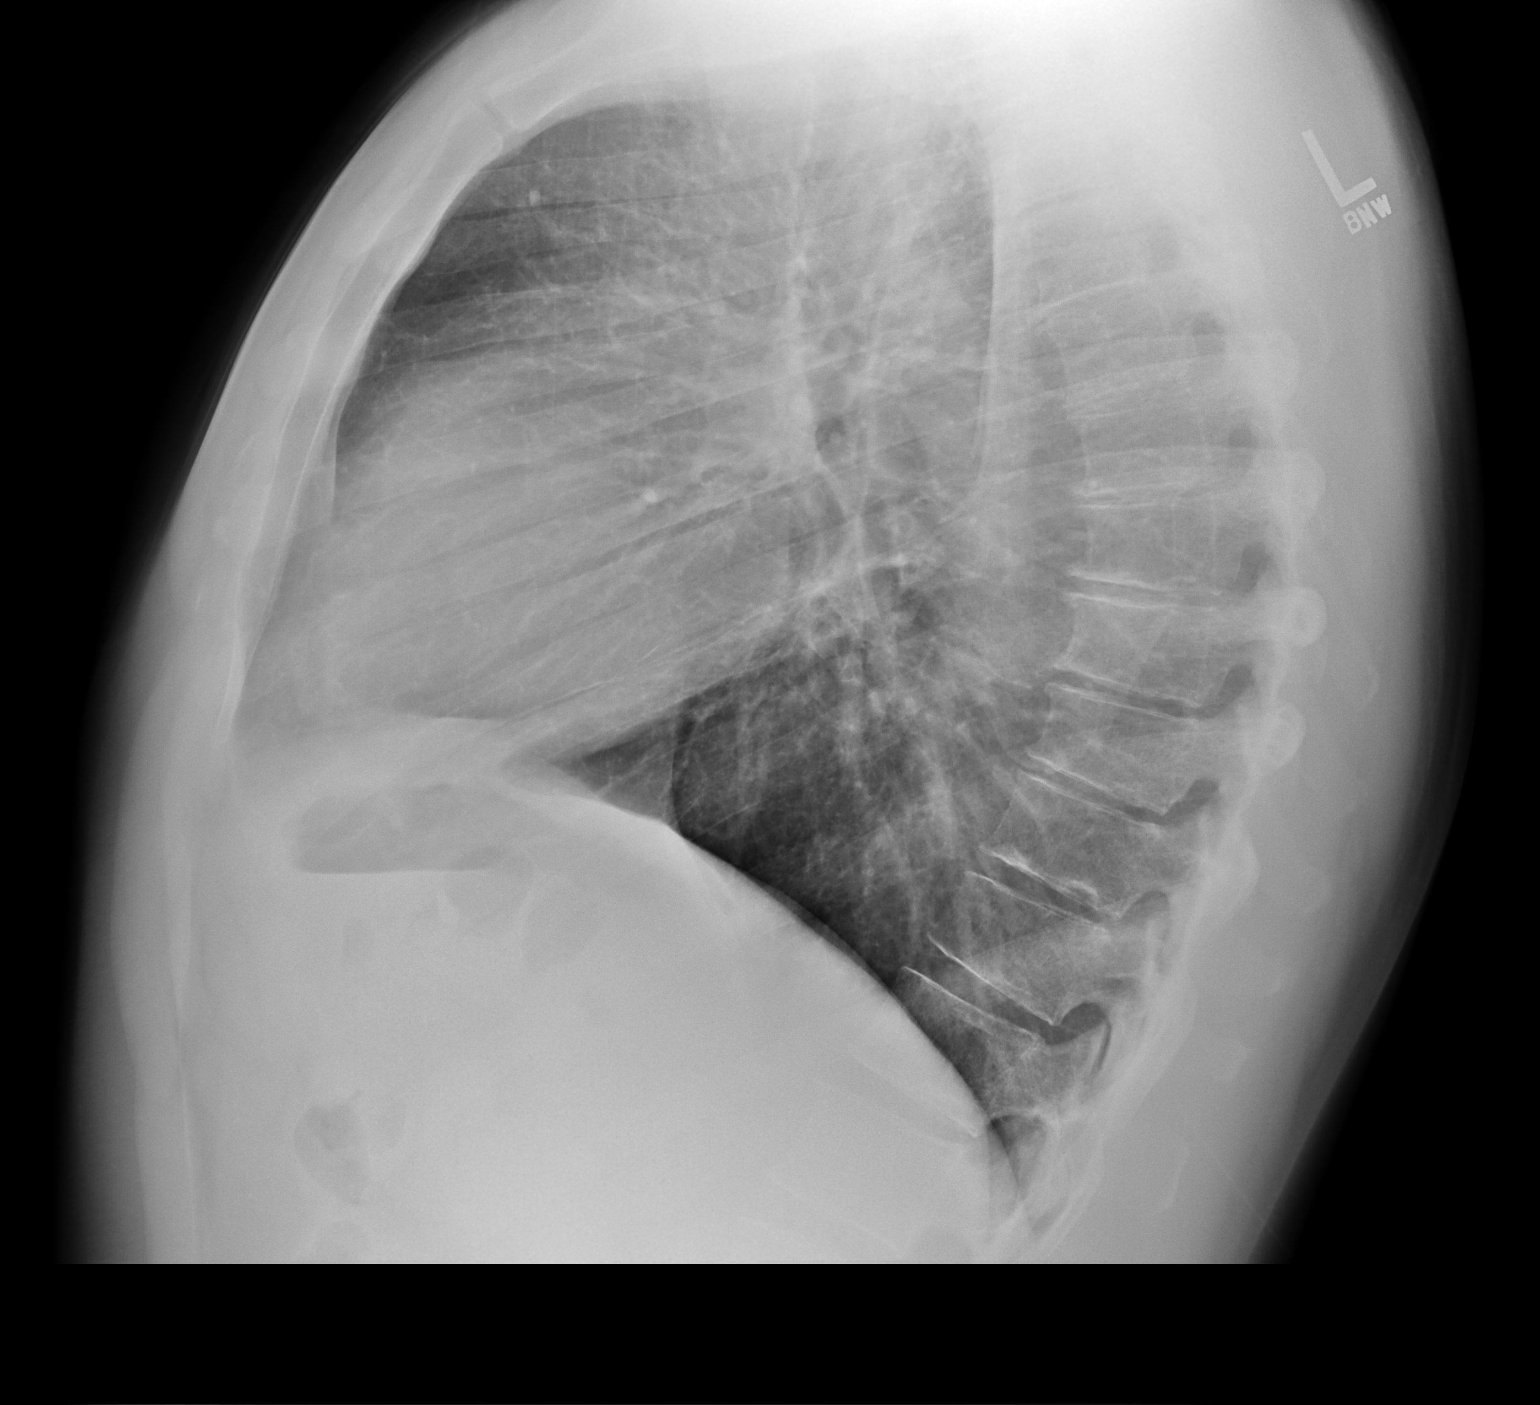

[2 of 2 positions shown; findings below may reference images not displayed]

FINDINGS: Normal heart size. Lungs clear. No pneumothorax. No pleural
effusion. Mid-level thoracic compression deformities have a chronic
appearance.
IMPRESSION: No active cardiopulmonary disease.

## 2019-08-25 ENCOUNTER — Other Ambulatory Visit: Payer: Self-pay

## 2019-08-25 DIAGNOSIS — Z20822 Contact with and (suspected) exposure to covid-19: Secondary | ICD-10-CM

## 2019-08-27 LAB — NOVEL CORONAVIRUS, NAA: SARS-CoV-2, NAA: NOT DETECTED

## 2020-01-28 ENCOUNTER — Other Ambulatory Visit: Payer: Self-pay

## 2020-01-28 ENCOUNTER — Encounter: Payer: Self-pay | Admitting: Internal Medicine

## 2020-01-28 ENCOUNTER — Ambulatory Visit (INDEPENDENT_AMBULATORY_CARE_PROVIDER_SITE_OTHER): Payer: BC Managed Care – PPO | Admitting: Internal Medicine

## 2020-01-28 ENCOUNTER — Other Ambulatory Visit (HOSPITAL_COMMUNITY)
Admission: RE | Admit: 2020-01-28 | Discharge: 2020-01-28 | Disposition: A | Discharge status: Home / Self Care | Payer: BC Managed Care – PPO | Source: Ambulatory Visit | Attending: Internal Medicine | Admitting: Internal Medicine

## 2020-01-28 VITALS — BP 128/82 | HR 80 | Temp 98.5°F | Ht 73.5 in | Wt 232.2 lb

## 2020-01-28 DIAGNOSIS — E559 Vitamin D deficiency, unspecified: Secondary | ICD-10-CM

## 2020-01-28 DIAGNOSIS — Z0000 Encounter for general adult medical examination without abnormal findings: Secondary | ICD-10-CM

## 2020-01-28 DIAGNOSIS — Z7253 High risk bisexual behavior: Secondary | ICD-10-CM

## 2020-01-28 DIAGNOSIS — J40 Bronchitis, not specified as acute or chronic: Secondary | ICD-10-CM | POA: Diagnosis not present

## 2020-01-28 DIAGNOSIS — Z1329 Encounter for screening for other suspected endocrine disorder: Secondary | ICD-10-CM | POA: Diagnosis not present

## 2020-01-28 DIAGNOSIS — Z202 Contact with and (suspected) exposure to infections with a predominantly sexual mode of transmission: Secondary | ICD-10-CM

## 2020-01-28 DIAGNOSIS — Z1159 Encounter for screening for other viral diseases: Secondary | ICD-10-CM

## 2020-01-28 DIAGNOSIS — Z113 Encounter for screening for infections with a predominantly sexual mode of transmission: Secondary | ICD-10-CM | POA: Diagnosis not present

## 2020-01-28 DIAGNOSIS — Z1389 Encounter for screening for other disorder: Secondary | ICD-10-CM

## 2020-01-28 DIAGNOSIS — Z79899 Other long term (current) drug therapy: Secondary | ICD-10-CM

## 2020-01-28 DIAGNOSIS — Z0184 Encounter for antibody response examination: Secondary | ICD-10-CM

## 2020-01-28 DIAGNOSIS — J309 Allergic rhinitis, unspecified: Secondary | ICD-10-CM | POA: Diagnosis not present

## 2020-01-28 LAB — CBC WITH DIFFERENTIAL/PLATELET
Basophils Absolute: 0 10*3/uL (ref 0.0–0.1)
Basophils Relative: 0.6 % (ref 0.0–3.0)
Eosinophils Absolute: 0.3 10*3/uL (ref 0.0–0.7)
Eosinophils Relative: 4.2 % (ref 0.0–5.0)
HCT: 41.6 % (ref 39.0–52.0)
Hemoglobin: 14 g/dL (ref 13.0–17.0)
Lymphocytes Relative: 22.5 % (ref 12.0–46.0)
Lymphs Abs: 1.5 10*3/uL (ref 0.7–4.0)
MCHC: 33.5 g/dL (ref 30.0–36.0)
MCV: 87.3 fl (ref 78.0–100.0)
Monocytes Absolute: 0.5 10*3/uL (ref 0.1–1.0)
Monocytes Relative: 8.1 % (ref 3.0–12.0)
Neutro Abs: 4.2 10*3/uL (ref 1.4–7.7)
Neutrophils Relative %: 64.6 % (ref 43.0–77.0)
Platelets: 190 10*3/uL (ref 150.0–400.0)
RBC: 4.77 Mil/uL (ref 4.22–5.81)
RDW: 12.9 % (ref 11.5–15.5)
WBC: 6.5 10*3/uL (ref 4.0–10.5)

## 2020-01-28 LAB — COMPREHENSIVE METABOLIC PANEL
ALT: 33 U/L (ref 0–53)
AST: 19 U/L (ref 0–37)
Albumin: 4.5 g/dL (ref 3.5–5.2)
Alkaline Phosphatase: 52 U/L (ref 39–117)
BUN: 16 mg/dL (ref 6–23)
CO2: 27 mEq/L (ref 19–32)
Calcium: 9.1 mg/dL (ref 8.4–10.5)
Chloride: 104 mEq/L (ref 96–112)
Creatinine, Ser: 0.95 mg/dL (ref 0.40–1.50)
GFR: 88.3 mL/min (ref >60.00)
Glucose, Bld: 104 mg/dL — ABNORMAL HIGH (ref 70–99)
Potassium: 4 mEq/L (ref 3.5–5.1)
Sodium: 136 mEq/L (ref 135–145)
Total Bilirubin: 0.4 mg/dL (ref 0.2–1.2)
Total Protein: 7.2 g/dL (ref 6.0–8.3)

## 2020-01-28 LAB — TSH: TSH: 2.01 u[IU]/mL (ref 0.35–4.50)

## 2020-01-28 LAB — VITAMIN D 25 HYDROXY (VIT D DEFICIENCY, FRACTURES): VITD: 33.14 ng/mL (ref 30.00–100.00)

## 2020-01-28 MED ORDER — AZITHROMYCIN 500 MG PO TABS: ORAL_TABLET | ORAL | 0 refills | Status: DC

## 2020-01-28 MED ORDER — ALBUTEROL SULFATE HFA 108 (90 BASE) MCG/ACT IN AERS: 1.0000 | INHALATION_SPRAY | Freq: Four times a day (QID) | RESPIRATORY_TRACT | 11 refills | Status: DC | PRN

## 2020-01-28 MED ORDER — DOXYCYCLINE HYCLATE 100 MG PO TABS: 100.0000 mg | ORAL_TABLET | Freq: Two times a day (BID) | ORAL | 0 refills | Status: DC

## 2020-01-28 MED ORDER — LORATADINE 10 MG PO TABS: 10.0000 mg | ORAL_TABLET | Freq: Every day | ORAL | 3 refills | Status: DC

## 2020-01-28 MED ORDER — FLUTICASONE PROPIONATE 50 MCG/ACT NA SUSP: 2.0000 | Freq: Every day | NASAL | 11 refills | Status: DC

## 2020-01-28 MED ORDER — AZELASTINE HCL 0.1 % NA SOLN: 2.0000 | Freq: Every day | NASAL | 2 refills | Status: DC

## 2020-01-28 NOTE — Addendum Note (Signed)
Addended by: Tilford Pillar on: 01/28/2020 11:52 AM   Modules accepted: Orders

## 2020-01-28 NOTE — Progress Notes (Signed)
Chief Complaint  Patient presents with  . Exposure to STD    Patient had recent exposure Sunday 01/17/20, would like STi testing and possibly starting PREP   F/u  1. STD check having sex with men and recently had 01/17/20 unprotected sex and partner + chlamydia new change separated/divorced wife he is possibly interested in PREP He does c/o lower ab pain  2. 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  HENT: Negative for hearing loss.   Eyes: Negative for blurred vision.  Respiratory: Negative for shortness of breath.   Cardiovascular: Negative for chest pain.  Gastrointestinal: Positive for abdominal pain.  Genitourinary: Negative for dysuria.  Musculoskeletal: Negative for falls.  Skin: Negative for rash.  Neurological: Negative for headaches.  Psychiatric/Behavioral: Negative for depression.   Past Medical History:  Diagnosis Date  . Allergy   . Asthma    childhood  . Chicken pox   . HNP (herniated nucleus pulposus), lumbar    Past Surgical History:  Procedure Laterality Date  . BACK SURGERY     repair of ruptured disc L4/L5   . LUMBAR LAMINECTOMY/DECOMPRESSION MICRODISCECTOMY Right 10/13/2015   Procedure: MICRO LUMBAR DECOMPRESSION L5 - S1 ON THE RIGHT, with specimen of l5-s1;  Surgeon: Susa Day, MD;  Location: [facility];  Service: Orthopedics;  Laterality: Right;   Family History  Problem Relation Age of Onset  . Cancer Father        liver  . Diabetes Father   . Heart disease Father        chf  . Early death Father        age 85   . Hyperlipidemia Father   . Hypertension Father   . Stroke Father   . Alcohol abuse Sister   . Depression Sister   . Drug abuse Sister   . Arthritis Maternal Grandmother   . Cancer Maternal Grandmother   . Hyperlipidemia Maternal Grandmother   . Hypertension Maternal Grandmother    . Arthritis Maternal Grandfather   . Heart disease Maternal Grandfather   . Hyperlipidemia Maternal Grandfather   . Hypertension Maternal Grandfather   . Arthritis Paternal Grandfather   . Depression Paternal Grandfather   . Hyperlipidemia Paternal Grandfather   . Heart disease Paternal Grandfather   . Hypertension Paternal Grandfather   . Stroke Paternal Grandfather    Social History   Socioeconomic History  . Marital status: Legally Separated    Spouse name: Not on file  . Number of children: Not on file  . Years of education: Not on file  . Highest education level: Not on file  Occupational History  . Not on file  Tobacco Use  . Smoking status: Never Smoker  . Smokeless tobacco: Never Used  Substance and Sexual Activity  . Alcohol use: Yes    Comment: occasional wine  . Drug use: No  . Sexual activity: Yes  Other Topics Concern  . 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 Difficulty of Paying  Living Expenses:   Food Insecurity:   . Worried About Programme researcher, broadcasting/film/video in the Last Year:   . Barista in the Last Year:   Transportation Needs:   . Freight forwarder (Medical):   Marland Kitchen Lack of Transportation (Non-Medical):   Physical Activity:   . Days of Exercise per Week:   . Minutes of Exercise per Session:   Stress:   . Feeling of Stress :   Social Connections:   . Frequency of Communication with Friends and Family:   . Frequency of Social Gatherings with Friends and Family:   . Attends Religious Services:   . Active Member of Clubs or Organizations:   . Attends Banker Meetings:   Marland Kitchen Marital Status:   Intimate Partner Violence:   . Fear of Current or Ex-Partner:   . Emotionally  Abused:   Marland Kitchen Physically Abused:   . Sexually Abused:    No outpatient medications have been marked as taking for the 01/28/20 encounter (Office Visit) with McLean-Scocuzza, Pasty Spillers, MD.   Allergies  Allergen Reactions  . Amoxicillin Other (See Comments)    Upset stomach   No results found for this or any previous visit (from the past 2160 hour(s)). Objective  Body mass index is 30.22 kg/m. Wt Readings from Last 3 Encounters:  01/28/20 232 lb 3.2 oz (105.3 kg)  02/18/18 216 lb 6.4 oz (98.2 kg)  02/04/18 219 lb 2 oz (99.4 kg)   Temp Readings from Last 3 Encounters:  01/28/20 98.5 F (36.9 C) (Temporal)  02/18/18 98.8 F (37.1 C) (Oral)  02/04/18 98.4 F (36.9 C) (Oral)   BP Readings from Last 3 Encounters:  01/28/20 128/82  02/18/18 118/68  02/04/18 128/68   Pulse Readings from Last 3 Encounters:  01/28/20 80  02/18/18 87  02/04/18 66    Physical Exam Vitals and nursing note reviewed.  Constitutional:      Appearance: Normal appearance. He is well-developed and well-groomed.  HENT:     Head: Normocephalic and atraumatic.  Eyes:     Conjunctiva/sclera: Conjunctivae normal.     Pupils: Pupils are equal, round, and reactive to light.  Cardiovascular:     Rate and Rhythm: Normal rate and regular rhythm.     Heart sounds: Normal heart sounds. No murmur.  Pulmonary:     Effort: Pulmonary effort is normal.     Breath sounds: Normal breath sounds.  Abdominal:     Tenderness: There is no abdominal tenderness.  Skin:    General: Skin is warm and dry.  Neurological:     General: No focal deficit present.     Mental Status: He is alert and oriented to person, place, and time. Mental status is at baseline.     Gait: Gait normal.  Psychiatric:        Attention and Perception: Attention and perception normal.        Mood and Affect: Mood and affect normal.        Speech: Speech normal.        Behavior: Behavior normal. Behavior is cooperative.        Thought Content:  Thought content normal.        Cognition and Memory: Cognition and memory normal.        Judgment: Judgment normal.     Assessment  Plan  Allergic rhinitis, unspecified seasonality, unspecified trigger - Plan: azelastine (ASTELIN) 0.1 % nasal spray, fluticasone (FLONASE) 50 MCG/ACT nasal spray, loratadine (CLARITIN)  10 MG tablet  Venereal disease screening STD check - Plan: Urine cytology ancillary only([place]), HIV antibody (with reflex), RPR, Hepatitis C antibody, Hepatitis A Ab, Total, Hepatitis A antibody, IgM, HSV(herpes simplex vrs) 1+2 ab-IgM, HSV(herpes simplex vrs) 1+2 ab-IgG rec safe sex    RPR, Hepatitis C antibody, Hepatitis A Ab, Total, Hepatitis A antibody, IgM, HSV(herpes simplex vrs) 1+2 ab-IgM, HSV(herpes simplex vrs) 1+2 ab-IgG  Chlamydia contact - Plan: azithromycin (ZITHROMAX) 500 MG tablet, doxycycline (VIBRA-TABS) 100 MG tablet   HM Did not have flu shot  utd Tdap    Hep B immune  covid 1/2   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 French Ana McLean-Scocuzza-Internal Medicine

## 2020-01-28 NOTE — Patient Instructions (Addendum)
Eustachian Tube Dysfunction  Eustachian tube dysfunction refers to a condition in which a blockage develops in the narrow passage that connects the middle ear to the back of the nose (eustachian tube). The eustachian tube regulates air pressure in the middle ear by letting air move between the ear and nose. It also helps to drain fluid from the middle ear space. Eustachian tube dysfunction can affect one or both ears. When the eustachian tube does not function properly, air pressure, fluid, or both can build up in the middle ear. What are the causes? This condition occurs when the eustachian tube becomes blocked or cannot open normally. Common causes of this condition include:  Ear infections.  Colds and other infections that affect the nose, mouth, and throat (upper respiratory tract).  Allergies.  Irritation from cigarette smoke.  Irritation from stomach acid coming up into the esophagus (gastroesophageal reflux). The esophagus is the tube that carries food from the mouth to the stomach.  Sudden changes in air pressure, such as from descending in an airplane or scuba diving.  Abnormal growths in the nose or throat, such as: ? Growths that line the nose (nasal polyps). ? Abnormal growth of cells (tumors). ? Enlarged tissue at the back of the throat (adenoids). What increases the risk? You are more likely to develop this condition if:  You smoke.  You are overweight.  You are a child who has: ? Certain birth defects of the mouth, such as cleft palate. ? Large tonsils or adenoids. What are the signs or symptoms? Common symptoms of this condition include:  A feeling of fullness in the ear.  Ear pain.  Clicking or popping noises in the ear.  Ringing in the ear.  Hearing loss.  Loss of balance.  Dizziness. Symptoms may get worse when the air pressure around you changes, such as when you travel to an area of high elevation, fly on an airplane, or go scuba diving. How is  this diagnosed? This condition may be diagnosed based on:  Your symptoms.  A physical exam of your ears, nose, and throat.  Tests, such as those that measure: ? The movement of your eardrum (tympanogram). ? Your hearing (audiometry). How is this treated? Treatment depends on the cause and severity of your condition.  In mild cases, you may relieve your symptoms by moving air into your ears. This is called "popping the ears."  In more severe cases, or if you have symptoms of fluid in your ears, treatment may include: ? Medicines to relieve congestion (decongestants). ? Medicines that treat allergies (antihistamines). ? Nasal sprays or ear drops that contain medicines that reduce swelling (steroids). ? A procedure to drain the fluid in your eardrum (myringotomy). In this procedure, a small tube is placed in the eardrum to:  Drain the fluid.  Restore the air in the middle ear space. ? A procedure to insert a balloon device through the nose to inflate the opening of the eustachian tube (balloon dilation). Follow these instructions at home: Lifestyle  Do not do any of the following until your health care provider approves: ? Travel to high altitudes. ? Fly in airplanes. ? Work in a pressurized cabin or room. ? Scuba dive.  Do not use any products that contain nicotine or tobacco, such as cigarettes and e-cigarettes. If you need help quitting, ask your health care provider.  Keep your ears dry. Wear fitted earplugs during showering and bathing. Dry your ears completely after. General instructions  Take over-the-counter   and prescription medicines only as told by your health care provider.  Use techniques to help pop your ears as recommended by your health care provider. These may include: ? Chewing gum. ? Yawning. ? Frequent, forceful swallowing. ? Closing your mouth, holding your nose closed, and gently blowing as if you are trying to blow air out of your nose.  Keep all  follow-up visits as told by your health care provider. This is important. Contact a health care provider if:  Your symptoms do not go away after treatment.  Your symptoms come back after treatment.  You are unable to pop your ears.  You have: ? A fever. ? Pain in your ear. ? Pain in your head or neck. ? Fluid draining from your ear.  Your hearing suddenly changes.  You become very dizzy.  You lose your balance. Summary  Eustachian tube dysfunction refers to a condition in which a blockage develops in the eustachian tube.  It can be caused by ear infections, allergies, inhaled irritants, or abnormal growths in the nose or throat.  Symptoms include ear pain, hearing loss, or ringing in the ears.  Mild cases are treated with maneuvers to unblock the ears, such as yawning or ear popping.  Severe cases are treated with medicines. Surgery may also be done (rare). This information is not intended to replace advice given to you by your health care provider. Make sure you discuss any questions you have with your health care provider. Document Revised: 02/11/2018 Document Reviewed: 02/11/2018 Elsevier Patient Education  [place]. Emtricitabine; Tenofovir disoproxil fumarate tablets What is this medicine? EMTRICITABINE; TENOFOVIR DISOPROXIL FUMARATE (em tri SIT uh bean; te NOE fo veer) is 2 antiretroviral medicines in 1 tablet. It is used with other medicines to treat HIV. This medicine is not a cure for HIV. This medicine can lower, but not fully prevent, the risk of spreading HIV to others. This medicine can also be used with safe sex practices to prevent HIV infection in high-risk persons. This medicine may be used for other purposes; ask your health care provider or pharmacist if you have questions. COMMON BRAND NAME(S): Truvada What should I tell my health care provider before I take this medicine? They need to know if you have any of these conditions:  bone  problems  kidney disease  liver disease  an unusual or allergic reaction to emtricitabine, tenofovir, other medicines, foods, dyes, or preservatives  pregnant or trying to get pregnant  breast-feeding How should I use this medicine? Take this medicine by mouth with a glass of water. Swallow tablets whole; do not cut, crush or chew. Follow the directions on the prescription label. You can take it with or without food. If it upsets your stomach, take it with food. Take your medicine at regular intervals. Do not take your medicine more often than directed. For your anti-HIV therapy to work as well as possible, take each dose exactly as prescribed. Do not skip doses or stop your medicine even if you feel better. Skipping doses may make the HIV virus resistant to this medicine and other medicines. Do not stop taking except on your doctor's advice. Talk to your pediatrician regarding the use of this medicine in children. While this drug may be prescribed for selected conditions, precautions do apply. Overdosage: If you think you have taken too much of this medicine contact a poison control center or emergency room at once. NOTE: This medicine is only for you. Do not share this medicine  with others. What if I miss a dose? If you miss a dose, take it as soon as you can. If it is almost time for your next dose, take only that dose. Do not take double or extra doses. What may interact with this medicine? Do not take this medicine with any of the following medications:  adefovir  any medicine that contains lamivudine  any medicine that contains emtricitabine or tenofovir This medicine may also interact with the following medications:  atazanavir  didanosine, ddI  lopinavir; ritonavir  medicines for viral infections like cidofovir, acyclovir, valacyclovir, ganciclovir, valganciclovir  saquinavir This list may not describe all possible interactions. Give your health care provider a list of  all the medicines, herbs, non-prescription drugs, or dietary supplements you use. Also tell them if you smoke, drink alcohol, or use illegal drugs. Some items may interact with your medicine. What should I watch for while using this medicine? Visit your doctor or health care professional for regular check ups. Discuss any new symptoms with your doctor. You will need to have important blood work done while on this medicine. HIV is spread to others through sexual or blood contact. Talk to your doctor about how to stop the spread of HIV. If you have hepatitis B, talk to your doctor if you plan to stop this medicine. The symptoms of hepatitis B may get worse if you stop this medicine. What side effects may I notice from receiving this medicine? Side effects that you should report to your doctor or health care professional as soon as possible:  allergic reactions like skin rash, itching or hives, swelling of the face, lips, or tongue  bone pain  breathing difficulties  changes in vision  dizziness  fast, irregular heartbeat  muscle pain  nausea, vomiting, unusual upset stomach or stomach pain  signs and symptoms of kidney injury like trouble passing urine or change in the amount of urine  signs and symptoms of liver injury like dark yellow or brown urine; general ill feeling or flu-like symptoms; light-colored stools; loss of appetite; nausea; right upper belly pain; unusually weak or tired; yellowing of the eyes or skin  signs of infection - fever or chills, cough, sore throat, pain or trouble passing urine Side effects that usually do not require medical attention (report to your doctor or health care professional if they continue or are bothersome):  abnormal dreams  cough  depressed mood  diarrhea  headache  skin discoloration  trouble sleeping This list may not describe all possible side effects. Call your doctor for medical advice about side effects. You may report side  effects to FDA at 1-800-FDA-1088. Where should I keep my medicine? Keep out of the reach of children. Store at room temperature between 15 and 30 degrees C (59 and 86 degrees F). Throw away any unused medicine after the expiration date. NOTE: This sheet is a summary. It may not cover all possible information. If you have questions about this medicine, talk to your doctor, pharmacist, or health care provider.  2020 Elsevier/Gold Standard (2017-09-12 11:07:40)

## 2020-01-29 LAB — URINE CYTOLOGY ANCILLARY ONLY
Chlamydia: NEGATIVE
Comment: NEGATIVE
Comment: NEGATIVE
Comment: NORMAL
Neisseria Gonorrhea: NEGATIVE
Trichomonas: NEGATIVE

## 2020-01-29 LAB — HEPATITIS A ANTIBODY, IGM: Hep A IgM: NONREACTIVE

## 2020-01-29 LAB — URINALYSIS, ROUTINE W REFLEX MICROSCOPIC
Bilirubin Urine: NEGATIVE
Glucose, UA: NEGATIVE
Hgb urine dipstick: NEGATIVE
Ketones, ur: NEGATIVE
Leukocytes,Ua: NEGATIVE
Nitrite: NEGATIVE
Protein, ur: NEGATIVE
Specific Gravity, Urine: 1.013 (ref 1.001–1.03)
pH: 5.5 (ref 5.0–8.0)

## 2020-01-29 LAB — HEPATITIS C ANTIBODY
Hepatitis C Ab: NONREACTIVE
SIGNAL TO CUT-OFF: 0.01 (ref <1.00)

## 2020-01-29 LAB — HIV ANTIBODY (ROUTINE TESTING W REFLEX): HIV 1&2 Ab, 4th Generation: NONREACTIVE

## 2020-01-29 LAB — HEPATITIS A ANTIBODY, TOTAL: Hepatitis A AB,Total: NONREACTIVE

## 2020-01-29 LAB — RPR: RPR Ser Ql: NONREACTIVE

## 2020-01-29 NOTE — Progress Notes (Signed)
Urine normal  Thyroid lab normal  D3 low rec D3 2000 to 4000 IU daily  Liver kidneys normal  Blood cts normal  Hepatitis C negative  RPR for spyhillis negative  Consider hepatitis A vaccine x 2 doses 6-12 months 2nd dose after 1st  No chlamydia seen, gonorrhea/trichomonas  Herpes type 1 +  HIV negative   Consider cholesterol in future   Vitamin D 33.14 low normal rec 2000 IU D3 daily no more than 4000 IU daily

## 2020-01-30 LAB — HSV(HERPES SIMPLEX VRS) I + II AB-IGG
HSV 1 Glycoprotein G Ab, IgG: 58.3 index — ABNORMAL HIGH (ref 0.00–0.90)
HSV 2 IgG, Type Spec: <0.91 index (ref 0.00–0.90)

## 2020-01-30 LAB — HSV(HERPES SIMPLEX VRS) I + II AB-IGM: HSVI/II Comb IgM: <0.91 Ratio (ref 0.00–0.90)

## 2020-05-16 ENCOUNTER — Encounter: Payer: Self-pay | Admitting: Internal Medicine

## 2020-06-07 ENCOUNTER — Encounter: Payer: Self-pay | Admitting: Internal Medicine

## 2020-06-17 ENCOUNTER — Other Ambulatory Visit: Payer: Self-pay | Admitting: Internal Medicine

## 2020-06-17 DIAGNOSIS — Z79899 Other long term (current) drug therapy: Secondary | ICD-10-CM | POA: Insufficient documentation

## 2020-06-17 DIAGNOSIS — Z7253 High risk bisexual behavior: Secondary | ICD-10-CM | POA: Insufficient documentation

## 2020-06-17 MED ORDER — EMTRICITABINE-TENOFOVIR DF 200-300 MG PO TABS: ORAL_TABLET | ORAL | 0 refills | Status: DC

## 2020-06-17 NOTE — Addendum Note (Signed)
Addended by: Quentin Ore on: 06/17/2020 07:10 PM   Modules accepted: Orders

## 2020-06-21 ENCOUNTER — Telehealth: Payer: Self-pay | Admitting: Internal Medicine

## 2020-06-21 NOTE — Telephone Encounter (Signed)
Calling patient to inform him that the RX for Truvada is ready to pick up in office,   Patient is also needing a follow up within a month.   Majority of information has been sent to the Patient via mychart.   Left message to return call.

## 2020-06-23 ENCOUNTER — Encounter: Payer: Self-pay | Admitting: Internal Medicine

## 2020-06-27 ENCOUNTER — Telehealth: Payer: Self-pay | Admitting: Internal Medicine

## 2020-06-27 MED FILL — EMTRICITABINE-TENOFOVIR DF: 200-300 | 30 days supply | Qty: 30 | Fill #0

## 2020-06-27 NOTE — Telephone Encounter (Signed)
Spoke with the patient. He will figure out which specialty pharmacy he is going to use and turn in the printed out RX. Then he will call back to let me know, this way I can start the PA.  

## 2020-06-27 NOTE — Telephone Encounter (Signed)
See mor recent telephone encounter for 06/27/20

## 2020-06-27 NOTE — Telephone Encounter (Signed)
Added to telephone encounter.

## 2020-06-27 NOTE — Telephone Encounter (Signed)
Called Patient to ask which pharmacy he will be filling this medication with. Needing this information in order to initiate PA.   If Patient calls back in please verify insurance information we have on file with him.

## 2020-06-27 NOTE — Telephone Encounter (Signed)
Can you call and discuss PA process with patient? This is f/u to my chart below  He will need to turn the Rx at the pharmacy in order to get the PA process started   Also call the pharmacy he will go to and see if they need a certain code and resend the medication in as prescribed to make the PA process easier please? This is for med Truvada and he wants to use as PREP prophylaxis for infection to HIV if he has intercourse with someone +   Dr Shirlee Latch-   I've scheduled a follow up appointment with you for 9/23.   I came by your office this afternoon and picked up this Rx and took it to my usual pharmacy. They told me that it would have to be filled by a specialty pharmacy and that I should call my insurance company for those locations, so I did.  My insurance company informed that I need to have a prior authorization approved by my insurance before I can get the prescription. The insurance company indicated that that authorization request should be initiated from your office.   I just called and spoke with someone in your office and she told me that the insurance company should have a form to fax to your office.    I am so confused.     [employer] said that if your office had questions about this form, you could call [phone].   Does any of this make sense? Any help would be much appreciated. Thanks. [name]   Thanks [employer]

## 2020-06-27 NOTE — Telephone Encounter (Signed)
McLean-Scocuzza, Pasty Spillers, MD  You 3 days ago  TM Can you call and discuss with patient    Message text   Bronwen Betters, CMA routed conversation to McLean-Scocuzza, Pasty Spillers, MD 3 days ago  Darrel Reach  McLean-Scocuzza, Pasty Spillers, MD 4 days ago  CW Dr Shirlee Latch-   I've scheduled a follow up appointment with you for 9/23.   I came by your office this afternoon and picked up this Rx and took it to my usual pharmacy. They told me that it would have to be filled by a specialty pharmacy and that I should call my insurance company for those locations, so I did.  My insurance company informed that I need to have a prior authorization approved by my insurance before I can get the prescription. The insurance company indicated that that authorization request should be initiated from your office.   I just called and spoke with someone in your office and she told me that the insurance company should have a form to fax to your office.    I am so confused.     [employer] said that if your office had questions about this form, you could call [phone].   Does any of this make sense? Any help would be much appreciated. Thanks. [name]

## 2020-06-27 NOTE — Telephone Encounter (Signed)
Spoke with the patient. He will figure out which specialty pharmacy he is going to use and turn in the printed out RX. Then he will call back to let me know, this way I can start the PA.

## 2020-07-26 MED FILL — EMTRICITABINE-TENOFOVIR DF: 200-300 | 30 days supply | Qty: 30 | Fill #1

## 2020-07-28 ENCOUNTER — Encounter: Payer: Self-pay | Admitting: Internal Medicine

## 2020-07-28 ENCOUNTER — Other Ambulatory Visit: Payer: Self-pay

## 2020-07-28 ENCOUNTER — Ambulatory Visit (INDEPENDENT_AMBULATORY_CARE_PROVIDER_SITE_OTHER): Payer: BC Managed Care – PPO | Admitting: Internal Medicine

## 2020-07-28 VITALS — BP 138/80 | HR 101 | Temp 98.5°F | Ht 73.5 in | Wt 229.1 lb

## 2020-07-28 DIAGNOSIS — Z79899 Other long term (current) drug therapy: Secondary | ICD-10-CM

## 2020-07-28 DIAGNOSIS — Z13220 Encounter for screening for lipoid disorders: Secondary | ICD-10-CM

## 2020-07-28 DIAGNOSIS — J309 Allergic rhinitis, unspecified: Secondary | ICD-10-CM | POA: Diagnosis not present

## 2020-07-28 DIAGNOSIS — Z113 Encounter for screening for infections with a predominantly sexual mode of transmission: Secondary | ICD-10-CM | POA: Diagnosis not present

## 2020-07-28 MED ORDER — AZELASTINE HCL 0.1 % NA SOLN: 2.0000 | Freq: Every day | NASAL | 11 refills | Status: DC

## 2020-07-28 NOTE — Patient Instructions (Addendum)
Call back dermatology referral if wanted Nasal saline over the counter 2sprays once a day as needed    Seborrheic Keratosis A seborrheic keratosis is a common, noncancerous (benign) skin growth. These growths are velvety, waxy, rough, tan, brown, or black spots that appear on the skin. These skin growths can be flat or raised, and scaly. What are the causes? The cause of this condition is not known. What increases the risk? You are more likely to develop this condition if you:  Have a family history of seborrheic keratosis.  Are 50 or older.  Are pregnant.  Have had estrogen replacement therapy. What are the signs or symptoms? Symptoms of this condition include growths on the face, chest, shoulders, back, or other areas. These growths:  Are usually painless, but may become irritated and itchy.  Can be yellow, brown, black, or other colors.  Are slightly raised or have a flat surface.  Are sometimes rough or wart-like in texture.  Are often velvety or waxy on the surface.  Are round or oval-shaped.  Often occur in groups, but may occur as a single growth. How is this diagnosed? This condition is diagnosed with a medical history and physical exam.  A sample of the growth may be tested (skin biopsy).  You may need to see a skin specialist (dermatologist). How is this treated? Treatment is not usually needed for this condition, unless the growths are irritated or bleed often.  You may also choose to have the growths removed if you do not like their appearance. ? Most commonly, these growths are treated with a procedure in which liquid nitrogen is applied to "freeze" off the growth (cryosurgery). ? They may also be burned off with electricity (electrocautery) or removed by scraping (curettage). Follow these instructions at home:  Watch your growth for any changes.  Keep all follow-up visits as told by your health care provider. This is important.  Do not scratch or pick  at the growth or growths. This can cause them to become irritated or infected. Contact a health care provider if:  You suddenly have many new growths.  Your growth bleeds, itches, or hurts.  Your growth suddenly becomes larger or changes color. Summary  A seborrheic keratosis is a common, noncancerous (benign) skin growth.  Treatment is not usually needed for this condition, unless the growths are irritated or bleed often.  Watch your growth for any changes.  Contact a health care provider if you suddenly have many new growths or your growth suddenly becomes larger or changes color.  Keep all follow-up visits as told by your health care provider. This is important. This information is not intended to replace advice given to you by your health care provider. Make sure you discuss any questions you have with your health care provider. Document Revised: 03/06/2018 Document Reviewed: 03/06/2018 Elsevier Patient Education  2020 [employer].

## 2020-07-28 NOTE — Progress Notes (Signed)
Chief Complaint  Patient presents with  . Follow-up   F/u  1. PreP f/u on Truvada 200-300 mg qd instead of prn and no side effects  Still using condoms and no IVDU  He reported truvada is coming out every other month injectable he heard but not sure if wants to pursue this    Review of Systems  Constitutional: Negative for weight loss.  HENT: Negative for hearing loss.   Eyes: Negative for blurred vision.  Respiratory: Negative for shortness of breath.   Cardiovascular: Negative for chest pain.  Gastrointestinal: Negative for abdominal pain.  Musculoskeletal: Negative for falls.  Skin: Negative for rash.  Neurological: Negative for headaches.  Psychiatric/Behavioral: Negative for depression.   Past Medical History:  Diagnosis Date  . Allergy   . Asthma    childhood  . Chicken pox   . HNP (herniated nucleus pulposus), lumbar    Past Surgical History:  Procedure Laterality Date  . BACK SURGERY     repair of ruptured disc L4/L5   . LUMBAR LAMINECTOMY/DECOMPRESSION MICRODISCECTOMY Right 10/13/2015   Procedure: MICRO LUMBAR DECOMPRESSION L5 - S1 ON THE RIGHT, with specimen of l5-s1;  Surgeon: Jene Every, MD;  Location: [facility];  Service: Orthopedics;  Laterality: Right;   Family History  Problem Relation Age of Onset  . Cancer Father        liver  . Diabetes Father   . Heart disease Father        chf  . Early death Father        age 1   . Hyperlipidemia Father   . Hypertension Father   . Stroke Father   . Alcohol abuse Sister   . Depression Sister   . Drug abuse Sister   . Arthritis Maternal Grandmother   . Cancer Maternal Grandmother   . Hyperlipidemia Maternal Grandmother   . Hypertension Maternal Grandmother   . Arthritis Maternal Grandfather   . Heart disease Maternal Grandfather   . Hyperlipidemia Maternal Grandfather   . Hypertension Maternal Grandfather   . Arthritis Paternal Grandfather   . Depression Paternal Grandfather   . Hyperlipidemia Paternal  Grandfather   . Heart disease Paternal Grandfather   . Hypertension Paternal Grandfather   . Stroke Paternal Grandfather    Social History   Socioeconomic History  . Marital status: Legally Separated    Spouse name: Not on file  . Number of children: Not on file  . Years of education: Not on file  . Highest education level: Not on file  Occupational History  . Not on file  Tobacco Use  . Smoking status: Never Smoker  . Smokeless tobacco: Never Used  Substance and Sexual Activity  . Alcohol use: Yes    Comment: occasional wine  . Drug use: No  . Sexual activity: Yes  Other Topics Concern  . 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 Difficulty of Paying Living Expenses: Not on file  Food Insecurity:   . Worried About Programme researcher, broadcasting/film/video in the Last Year: Not on file  . Ran Out of Food in the Last Year: Not on file  Transportation  Needs:   . Lack of Transportation (Medical): Not on file  . Lack of Transportation (Non-Medical): Not on file  Physical Activity:   . Days of Exercise per Week: Not on file  . Minutes of Exercise per Session: Not on file  Stress:   . Feeling of Stress : Not on file  Social Connections:   . Frequency of Communication with Friends and Family: Not on file  . Frequency of Social Gatherings with Friends and Family: Not on file  . Attends Religious Services: Not on file  . Active Member of Clubs or Organizations: Not on file  . Attends Banker Meetings: Not on file  . Marital Status: Not on file  Intimate Partner Violence:   . Fear of Current or Ex-Partner: Not on file  . Emotionally Abused: Not on file  . Physically Abused: Not on file  . Sexually Abused: Not on file    Current Meds  Medication Sig  . albuterol (VENTOLIN HFA) 108 (90 Base) MCG/ACT inhaler Inhale 1-2 puffs into the lungs every 6 (six) hours as needed for wheezing or shortness of breath.  Marland Kitchen azelastine (ASTELIN) 0.1 % nasal spray Place 2 sprays into both nostrils daily. Use in each nostril as directed  . emtricitabine-tenofovir (TRUVADA) 200-300 MG tablet 2 tablets on day 1 followed by 1 tablet daily prevention. On demand 2 tablets taken 2-24 hours prior to sex, 1 tablet 24 hours later and 1 tablet 24 hours after that. If using more than 1x per week on demand dosing then 1 tablet should be used as the 1st dose (Patient taking differently: Take 1 tablet by mouth daily. 2 tablets on day 1 followed by 1 tablet daily prevention. On demand 2 tablets taken 2-24 hours prior to sex, 1 tablet 24 hours later and 1 tablet 24 hours after that. If using more than 1x per week on demand dosing then 1 tablet should be used as the 1st dose)  . fluticasone (FLONASE) 50 MCG/ACT nasal spray Place 2 sprays into both nostrils daily.  Marland Kitchen loratadine (CLARITIN) 10 MG tablet Take 1 tablet (10 mg total) by mouth daily.  . [DISCONTINUED] azelastine (ASTELIN) 0.1 % nasal spray Place 2 sprays into both nostrils daily. Use in each nostril as directed  . [DISCONTINUED] azithromycin (ZITHROMAX) 500 MG tablet 2 pills x 1 dose  . [DISCONTINUED] doxycycline (VIBRA-TABS) 100 MG tablet Take 1 tablet (100 mg total) by mouth 2 (two) times daily. With food   Allergies  Allergen Reactions  . Amoxicillin Other (See Comments)    Upset stomach   No results found for this or any previous visit (from the past 2160 hour(s)). Objective  Body mass index is 29.82 kg/m. Wt Readings from Last 3 Encounters:  07/28/20 229 lb 1.9 oz (103.9 kg)  01/28/20 232 lb 3.2 oz (105.3 kg)  02/18/18 216 lb 6.4 oz (98.2 kg)   Temp Readings from Last 3 Encounters:  07/28/20 98.5 F (36.9 C) (Oral)  01/28/20 98.5 F (36.9 C) (Temporal)  02/18/18 98.8 F  (37.1 C) (Oral)   BP Readings from Last 3 Encounters:  07/28/20 138/80  01/28/20 128/82  02/18/18 118/68   Pulse Readings from Last 3 Encounters:  07/28/20 (!) 101  01/28/20 80  02/18/18 87    Physical Exam Vitals and nursing note reviewed.  Constitutional:      Appearance: Normal appearance. He is well-developed and well-groomed.  HENT:     Head: Normocephalic and atraumatic.  Eyes:  Conjunctiva/sclera: Conjunctivae normal.     Pupils: Pupils are equal, round, and reactive to light.  Cardiovascular:     Rate and Rhythm: Normal rate and regular rhythm.     Heart sounds: Normal heart sounds. No murmur heard.   Pulmonary:     Effort: Pulmonary effort is normal.     Breath sounds: Normal breath sounds.  Skin:    General: Skin is warm and dry.  Neurological:     General: No focal deficit present.     Mental Status: He is alert and oriented to person, place, and time. Mental status is at baseline.     Gait: Gait normal.  Psychiatric:        Attention and Perception: Attention and perception normal.        Mood and Affect: Mood and affect normal.        Speech: Speech normal.        Behavior: Behavior normal. Behavior is cooperative.        Thought Content: Thought content normal.        Cognition and Memory: Cognition and memory normal.        Judgment: Judgment normal.     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 French Ana McLean-Scocuzza-Internal Medicine

## 2020-07-29 LAB — BASIC METABOLIC PANEL
BUN: 17 mg/dL (ref 6–23)
CO2: 29 mEq/L (ref 19–32)
Calcium: 9.5 mg/dL (ref 8.4–10.5)
Chloride: 103 mEq/L (ref 96–112)
Creatinine, Ser: 1.28 mg/dL (ref 0.40–1.50)
GFR: 62.43 mL/min (ref >60.00)
Glucose, Bld: 80 mg/dL (ref 70–99)
Potassium: 4.4 mEq/L (ref 3.5–5.1)
Sodium: 141 mEq/L (ref 135–145)

## 2020-08-22 MED FILL — EMTRICITABINE-TENOFOVIR DF: 200-300 | 30 days supply | Qty: 30 | Fill #2

## 2020-09-13 ENCOUNTER — Other Ambulatory Visit: Payer: Self-pay | Admitting: Internal Medicine

## 2020-09-13 DIAGNOSIS — Z79899 Other long term (current) drug therapy: Secondary | ICD-10-CM

## 2020-09-13 DIAGNOSIS — Z7253 High risk bisexual behavior: Secondary | ICD-10-CM

## 2020-09-13 MED ORDER — EMTRICITABINE-TENOFOVIR DF 200-300 MG PO TABS: 1.0000 | ORAL_TABLET | Freq: Every day | ORAL | 3 refills | Status: DC

## 2020-09-19 ENCOUNTER — Telehealth: Payer: Self-pay | Admitting: Internal Medicine

## 2020-09-19 NOTE — Telephone Encounter (Signed)
Faxed to [facility] outpatient pharmacy for EMTRICITABINE-TENOFOVIR DF qty 90 faxed on 09-19-2020

## 2020-09-20 MED FILL — EMTRICITABINE-TENOFOVIR DF: 200-300 | 30 days supply | Qty: 30 | Fill #0

## 2020-09-27 ENCOUNTER — Other Ambulatory Visit: Payer: Self-pay

## 2020-09-27 DIAGNOSIS — Z79899 Other long term (current) drug therapy: Secondary | ICD-10-CM

## 2020-09-27 DIAGNOSIS — Z7253 High risk bisexual behavior: Secondary | ICD-10-CM

## 2020-09-27 MED ORDER — EMTRICITABINE-TENOFOVIR DF 200-300 MG PO TABS: 1.0000 | ORAL_TABLET | Freq: Every day | ORAL | 3 refills | Status: DC

## 2020-10-05 ENCOUNTER — Ambulatory Visit (INDEPENDENT_AMBULATORY_CARE_PROVIDER_SITE_OTHER): Payer: BC Managed Care – PPO | Admitting: Internal Medicine

## 2020-10-05 ENCOUNTER — Other Ambulatory Visit: Payer: Self-pay

## 2020-10-05 ENCOUNTER — Encounter: Payer: Self-pay | Admitting: Internal Medicine

## 2020-10-05 ENCOUNTER — Ambulatory Visit: Payer: BC Managed Care – PPO | Admitting: Family

## 2020-10-05 VITALS — BP 122/82 | HR 74 | Temp 98.1°F | Ht 73.5 in | Wt 236.6 lb

## 2020-10-05 DIAGNOSIS — H6983 Other specified disorders of Eustachian tube, bilateral: Secondary | ICD-10-CM | POA: Diagnosis not present

## 2020-10-05 MED ORDER — PREDNISONE 20 MG PO TABS: ORAL_TABLET | ORAL | 0 refills | Status: DC

## 2020-10-05 NOTE — Progress Notes (Signed)
Chief Complaint  Patient presents with  . Ear Problem   F/u 1. C/o fluid in ears x 2 years likely swimmers ear/ear congestion b/l ears pain is dull but 0/10 and popping sounds and congested and muffled sounds to ears going to [place] Friday and wants relief before the flight     Review of Systems  Constitutional: Negative for weight loss.  HENT: Positive for ear pain. Negative for hearing loss and sinus pain.        Ear congestion   Eyes: Negative for blurred vision.  Respiratory: Negative for shortness of breath.   Cardiovascular: Negative for chest pain.  Gastrointestinal: Negative for abdominal pain.  Skin: Negative for rash.   Past Medical History:  Diagnosis Date  . Allergy   . Asthma    childhood  . Chicken pox   . HNP (herniated nucleus pulposus), lumbar    Past Surgical History:  Procedure Laterality Date  . BACK SURGERY     repair of ruptured disc L4/L5   . LUMBAR LAMINECTOMY/DECOMPRESSION MICRODISCECTOMY Right 10/13/2015   Procedure: MICRO LUMBAR DECOMPRESSION L5 - S1 ON THE RIGHT, with specimen of l5-s1;  Surgeon: Jene Every, MD;  Location: [facility];  Service: Orthopedics;  Laterality: Right;   Family History  Problem Relation Age of Onset  . Cancer Father        liver  . Diabetes Father   . Heart disease Father        chf  . Early death Father        age 30   . Hyperlipidemia Father   . Hypertension Father   . Stroke Father   . Alcohol abuse Sister   . Depression Sister   . Drug abuse Sister   . Arthritis Maternal Grandmother   . Cancer Maternal Grandmother   . Hyperlipidemia Maternal Grandmother   . Hypertension Maternal Grandmother   . Arthritis Maternal Grandfather   . Heart disease Maternal Grandfather   . Hyperlipidemia Maternal Grandfather   . Hypertension Maternal Grandfather   . Arthritis Paternal Grandfather   . Depression Paternal Grandfather   . Hyperlipidemia Paternal Grandfather   . Heart disease Paternal Grandfather   .  Hypertension Paternal Grandfather   . Stroke Paternal Grandfather    Social History   Socioeconomic History  . Marital status: Legally Separated    Spouse name: Not on file  . Number of children: Not on file  . Years of education: Not on file  . Highest education level: Not on file  Occupational History  . Not on file  Tobacco Use  . Smoking status: Never Smoker  . Smokeless tobacco: Never Used  Substance and Sexual Activity  . Alcohol use: Yes    Comment: occasional wine  . Drug use: No  . Sexual activity: Yes  Other Topics Concern  . 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 Difficulty of Paying Living Expenses: Not on file  Food Insecurity:   . Worried About Programme researcher, broadcasting/film/video in the Last Year: Not on file  . Ran Out of Food in the Last Year: Not on file  Transportation Needs:   .  Lack of Transportation (Medical): Not on file  . Lack of Transportation (Non-Medical): Not on file  Physical Activity:   . Days of Exercise per Week: Not on file  . Minutes of Exercise per Session: Not on file  Stress:   . Feeling of Stress : Not on file  Social Connections:   . Frequency of Communication with Friends and Family: Not on file  . Frequency of Social Gatherings with Friends and Family: Not on file  . Attends Religious Services: Not on file  . Active Member of Clubs or Organizations: Not on file  . Attends Banker Meetings: Not on file  . Marital Status: Not on file  Intimate Partner Violence:   . Fear of Current or Ex-Partner: Not on file  . Emotionally Abused: Not on file  . Physically Abused: Not on file  . Sexually Abused: Not on file   Current Meds  Medication Sig  . albuterol (VENTOLIN HFA)  108 (90 Base) MCG/ACT inhaler Inhale 1-2 puffs into the lungs every 6 (six) hours as needed for wheezing or shortness of breath.  Marland Kitchen azelastine (ASTELIN) 0.1 % nasal spray Place 2 sprays into both nostrils daily. Use in each nostril as directed  . emtricitabine-tenofovir (TRUVADA) 200-300 MG tablet Take 1 tablet by mouth daily. 2 tablets on day 1 followed by 1 tablet daily prevention. On demand 2 tablets taken 2-24 hours prior to sex, 1 tablet 24 hours later and 1 tablet 24 hours after that. If using more than 1x per week ON DEMAND dosing then 1 tablet should be used as the 1st dose  . fluticasone (FLONASE) 50 MCG/ACT nasal spray Place 2 sprays into both nostrils daily.  Marland Kitchen loratadine (CLARITIN) 10 MG tablet Take 1 tablet (10 mg total) by mouth daily.   Allergies  Allergen Reactions  . Amoxicillin Other (See Comments)    Upset stomach   Recent Results (from the past 2160 hour(s))  Basic Metabolic Panel (BMET)     Status: None   Collection Time: 07/28/20  3:52 PM  Result Value Ref Range   Sodium 141 135 - 145 mEq/L   Potassium 4.4 3.5 - 5.1 mEq/L   Chloride 103 96 - 112 mEq/L   CO2 29 19 - 32 mEq/L   Glucose, Bld 80 70 - 99 mg/dL   BUN 17 6 - 23 mg/dL   Creatinine, Ser 9.41 0.40 - 1.50 mg/dL   GFR 74.08 >14.48 mL/min   Calcium 9.5 8.4 - 10.5 mg/dL   Objective  Body mass index is 30.79 kg/m. Wt Readings from Last 3 Encounters:  10/05/20 236 lb 9.6 oz (107.3 kg)  07/28/20 229 lb 1.9 oz (103.9 kg)  01/28/20 232 lb 3.2 oz (105.3 kg)   Temp Readings from Last 3 Encounters:  10/05/20 98.1 F (36.7 C) (Oral)  07/28/20 98.5 F (36.9 C) (Oral)  01/28/20 98.5 F (36.9 C) (Temporal)   BP Readings from Last 3 Encounters:  10/05/20 122/82  07/28/20 138/80  01/28/20 128/82   Pulse Readings from Last 3 Encounters:  10/05/20 74  07/28/20 (!) 101  01/28/20 80    Physical Exam Vitals and nursing note reviewed.  Constitutional:      Appearance: Normal appearance. He is well-developed  and well-groomed. He is obese.  HENT:     Head: Normocephalic and atraumatic.     Ears:     Comments: B/l fluid rim in ears  Eyes:     Conjunctiva/sclera: Conjunctivae normal.  Pupils: Pupils are equal, round, and reactive to light.  Cardiovascular:     Rate and Rhythm: Normal rate and regular rhythm.     Heart sounds: Normal heart sounds. No murmur heard.   Pulmonary:     Effort: Pulmonary effort is normal.     Breath sounds: Normal breath sounds.  Skin:    General: Skin is warm and dry.  Neurological:     General: No focal deficit present.     Mental Status: He is alert and oriented to person, place, and time. Mental status is at baseline.     Gait: Gait normal.  Psychiatric:        Attention and Perception: Attention and perception normal.        Mood and Affect: Mood and affect normal.        Speech: Speech normal.        Behavior: Behavior normal. Behavior is cooperative.        Thought Content: Thought content normal.        Cognition and Memory: Cognition and memory normal.        Judgment: Judgment normal.     Assessment  Plan  Dysfunction of both eustachian tubes - Plan: Ambulatory referral to ENTconsider xyzal/allegra Steroid taper  Flonase/astelin  otc sudafed  HM-CPE 01/2021  Did not have flu shot, declines flu shot utd Tdap moderna 2/2 consider booster  Hep B immune  STD check+ HSV 1 pt aware Check lipidin future  Eye MD Heather Burundi in [place] saw 2018  [facility]  Provider: Dr. French Ana McLean-Scocuzza-Internal Medicine

## 2020-10-05 NOTE — Patient Instructions (Addendum)
Allegra or xyzal consider in the future   Dr. Elenore Rota [facility] [address]  [phone]  Sudafed    Eustachian Tube Dysfunction  Eustachian tube dysfunction refers to a condition in which a blockage develops in the narrow passage that connects the middle ear to the back of the nose (eustachian tube). The eustachian tube regulates air pressure in the middle ear by letting air move between the ear and nose. It also helps to drain fluid from the middle ear space. Eustachian tube dysfunction can affect one or both ears. When the eustachian tube does not function properly, air pressure, fluid, or both can build up in the middle ear. What are the causes? This condition occurs when the eustachian tube becomes blocked or cannot open normally. Common causes of this condition include:  Ear infections.  Colds and other infections that affect the nose, mouth, and throat (upper respiratory tract).  Allergies.  Irritation from cigarette smoke.  Irritation from stomach acid coming up into the esophagus (gastroesophageal reflux). The esophagus is the tube that carries food from the mouth to the stomach.  Sudden changes in air pressure, such as from descending in an airplane or scuba diving.  Abnormal growths in the nose or throat, such as: ? Growths that line the nose (nasal polyps). ? Abnormal growth of cells (tumors). ? Enlarged tissue at the back of the throat (adenoids). What increases the risk? You are more likely to develop this condition if:  You smoke.  You are overweight.  You are a child who has: ? Certain birth defects of the mouth, such as cleft palate. ? Large tonsils or adenoids. What are the signs or symptoms? Common symptoms of this condition include:  A feeling of fullness in the ear.  Ear pain.  Clicking or popping noises in the ear.  Ringing in the ear.  Hearing loss.  Loss of balance.  Dizziness. Symptoms may get worse when the air  pressure around you changes, such as when you travel to an area of high elevation, fly on an airplane, or go scuba diving. How is this diagnosed? This condition may be diagnosed based on:  Your symptoms.  A physical exam of your ears, nose, and throat.  Tests, such as those that measure: ? The movement of your eardrum (tympanogram). ? Your hearing (audiometry). How is this treated? Treatment depends on the cause and severity of your condition.  In mild cases, you may relieve your symptoms by moving air into your ears. This is called "popping the ears."  In more severe cases, or if you have symptoms of fluid in your ears, treatment may include: ? Medicines to relieve congestion (decongestants). ? Medicines that treat allergies (antihistamines). ? Nasal sprays or ear drops that contain medicines that reduce swelling (steroids). ? A procedure to drain the fluid in your eardrum (myringotomy). In this procedure, a small tube is placed in the eardrum to:  Drain the fluid.  Restore the air in the middle ear space. ? A procedure to insert a balloon device through the nose to inflate the opening of the eustachian tube (balloon dilation). Follow these instructions at home: Lifestyle  Do not do any of the following until your health care provider approves: ? Travel to high altitudes. ? Fly in airplanes. ? Work in a Estate agent or room. ? Scuba dive.  Do not use any products that contain nicotine or tobacco, such as cigarettes and e-cigarettes. If you need help quitting, ask  your health care provider.  Keep your ears dry. Wear fitted earplugs during showering and bathing. Dry your ears completely after. General instructions  Take over-the-counter and prescription medicines only as told by your health care provider.  Use techniques to help pop your ears as recommended by your health care provider. These may include: ? Chewing gum. ? Yawning. ? Frequent, forceful  swallowing. ? Closing your mouth, holding your nose closed, and gently blowing as if you are trying to blow air out of your nose.  Keep all follow-up visits as told by your health care provider. This is important. Contact a health care provider if:  Your symptoms do not go away after treatment.  Your symptoms come back after treatment.  You are unable to pop your ears.  You have: ? A fever. ? Pain in your ear. ? Pain in your head or neck. ? Fluid draining from your ear.  Your hearing suddenly changes.  You become very dizzy.  You lose your balance. Summary  Eustachian tube dysfunction refers to a condition in which a blockage develops in the eustachian tube.  It can be caused by ear infections, allergies, inhaled irritants, or abnormal growths in the nose or throat.  Symptoms include ear pain, hearing loss, or ringing in the ears.  Mild cases are treated with maneuvers to unblock the ears, such as yawning or ear popping.  Severe cases are treated with medicines. Surgery may also be done (rare). This information is not intended to replace advice given to you by your health care provider. Make sure you discuss any questions you have with your health care provider. Document Revised: 02/11/2018 Document Reviewed: 02/11/2018 Elsevier Patient Education  [place].

## 2020-10-26 MED FILL — EMTRICITABINE-TENOFOVIR DF: 200-300 | 30 days supply | Qty: 30 | Fill #1

## 2020-11-02 ENCOUNTER — Other Ambulatory Visit: Payer: BC Managed Care – PPO

## 2020-12-02 ENCOUNTER — Inpatient Hospital Stay (HOSPITAL_COMMUNITY): Admit: 2020-12-02 | Payer: BLUE CROSS/BLUE SHIELD

## 2020-12-02 ENCOUNTER — Other Ambulatory Visit: Payer: Self-pay

## 2020-12-02 ENCOUNTER — Other Ambulatory Visit (INDEPENDENT_AMBULATORY_CARE_PROVIDER_SITE_OTHER): Payer: BLUE CROSS/BLUE SHIELD

## 2020-12-02 DIAGNOSIS — Z79899 Other long term (current) drug therapy: Secondary | ICD-10-CM | POA: Diagnosis not present

## 2020-12-02 DIAGNOSIS — Z113 Encounter for screening for infections with a predominantly sexual mode of transmission: Secondary | ICD-10-CM | POA: Diagnosis not present

## 2020-12-02 DIAGNOSIS — Z13220 Encounter for screening for lipoid disorders: Secondary | ICD-10-CM | POA: Diagnosis not present

## 2020-12-02 LAB — LIPID PANEL
Cholesterol: 153 mg/dL (ref 0–200)
HDL: 42.6 mg/dL (ref >39.00)
LDL Cholesterol: 95 mg/dL (ref 0–99)
NonHDL: 110.02
Total CHOL/HDL Ratio: 4
Triglycerides: 76 mg/dL (ref 0.0–149.0)
VLDL: 15.2 mg/dL (ref 0.0–40.0)

## 2020-12-02 LAB — COMPREHENSIVE METABOLIC PANEL
ALT: 49 U/L (ref 0–53)
AST: 29 U/L (ref 0–37)
Albumin: 4.3 g/dL (ref 3.5–5.2)
Alkaline Phosphatase: 51 U/L (ref 39–117)
BUN: 21 mg/dL (ref 6–23)
CO2: 27 mEq/L (ref 19–32)
Calcium: 9.2 mg/dL (ref 8.4–10.5)
Chloride: 104 mEq/L (ref 96–112)
Creatinine, Ser: 1.04 mg/dL (ref 0.40–1.50)
GFR: 90.3 mL/min (ref >60.00)
Glucose, Bld: 105 mg/dL — ABNORMAL HIGH (ref 70–99)
Potassium: 5 mEq/L (ref 3.5–5.1)
Sodium: 136 mEq/L (ref 135–145)
Total Bilirubin: 0.4 mg/dL (ref 0.2–1.2)
Total Protein: 6.9 g/dL (ref 6.0–8.3)

## 2020-12-05 LAB — HEPATITIS C ANTIBODY
Hepatitis C Ab: NONREACTIVE
SIGNAL TO CUT-OFF: 0.01 (ref <1.00)

## 2020-12-05 LAB — URINE CYTOLOGY ANCILLARY ONLY
Chlamydia: NEGATIVE
Comment: NEGATIVE
Comment: NEGATIVE
Comment: NORMAL
Neisseria Gonorrhea: NEGATIVE
Trichomonas: NEGATIVE

## 2020-12-05 LAB — HEPATITIS B SURFACE ANTIGEN: Hepatitis B Surface Ag: NONREACTIVE

## 2020-12-05 LAB — RPR: RPR Ser Ql: NONREACTIVE

## 2020-12-05 LAB — HIV ANTIBODY (ROUTINE TESTING W REFLEX): HIV 1&2 Ab, 4th Generation: NONREACTIVE

## 2020-12-27 MED FILL — EMTRICITABINE-TENOFOVIR DF: 200-300 | 30 days supply | Qty: 30 | Fill #2

## 2021-01-26 ENCOUNTER — Ambulatory Visit (INDEPENDENT_AMBULATORY_CARE_PROVIDER_SITE_OTHER): Payer: BLUE CROSS/BLUE SHIELD | Admitting: Internal Medicine

## 2021-01-26 ENCOUNTER — Other Ambulatory Visit: Payer: Self-pay

## 2021-01-26 ENCOUNTER — Encounter: Payer: Self-pay | Admitting: Internal Medicine

## 2021-01-26 VITALS — BP 138/82 | HR 76 | Ht 74.0 in | Wt 240.4 lb

## 2021-01-26 DIAGNOSIS — Z0000 Encounter for general adult medical examination without abnormal findings: Secondary | ICD-10-CM | POA: Diagnosis not present

## 2021-01-26 DIAGNOSIS — J40 Bronchitis, not specified as acute or chronic: Secondary | ICD-10-CM

## 2021-01-26 DIAGNOSIS — J309 Allergic rhinitis, unspecified: Secondary | ICD-10-CM | POA: Diagnosis not present

## 2021-01-26 DIAGNOSIS — R030 Elevated blood-pressure reading, without diagnosis of hypertension: Secondary | ICD-10-CM | POA: Diagnosis not present

## 2021-01-26 NOTE — Patient Instructions (Addendum)
panoxyl benzoyl peroxide 8% otc let sit x 5 minutes  Goal BP <130/<80 Automatic upper arm BP cuff   Cooking With Less Salt Cooking with less salt is one way to reduce the amount of sodium you get from food. Sodium is one of the elements that make up salt. It is found naturally in foods and is also added to certain foods. Depending on your condition and overall health, your health care provider or dietitian may recommend that you reduce your sodium intake. Most people should have less than 2,300 milligrams (mg) of sodium each day. If you have high blood pressure (hypertension), you may need to limit your sodium to 1,500 mg each day. Follow the tips below to help reduce your sodium intake. What are tips for eating less sodium? Reading food labels  Check the food label before buying or using packaged ingredients. Always check the label for the serving size and sodium content.  Look for products with no more than 140 mg of sodium in one serving.  Check the % Daily Value column to see what percent of the daily recommended amount of sodium is provided in one serving of the product. Foods with 5% or less in this column are considered low in sodium. Foods with 20% or higher are considered high in sodium.  Do not choose foods with salt as one of the first three ingredients on the ingredients list. If salt is one of the first three ingredients, it usually means the item is high in sodium.   Shopping  Buy sodium-free or low-sodium products. Look for the following words on food labels: ? Low-sodium. ? Sodium-free. ? Reduced-sodium. ? No salt added. ? Unsalted.  Always check the sodium content even if foods are labeled as low-sodium or no salt added.  Buy fresh foods. Cooking  Use herbs, seasonings without salt, and spices as substitutes for salt.  Use sodium-free baking soda when baking.  Grill, braise, or roast foods to add flavor with less salt.  Avoid adding salt to pasta, rice, or hot  cereals.  Drain and rinse canned vegetables, beans, and meat before use.  Avoid adding salt when cooking sweets and desserts.  Cook with low-sodium ingredients. What foods are high in sodium? Vegetables Regular canned vegetables (not low-sodium or reduced-sodium). Sauerkraut, pickled vegetables, and relishes. Olives. [place] fries. Onion rings. Regular canned tomato sauce and paste. Regular tomato and vegetable juice. Frozen vegetables in sauces. Grains Instant hot cereals. Bread stuffing, pancake, and biscuit mixes. Croutons. Seasoned rice or pasta mixes. Noodle soup cups. Boxed or frozen macaroni and cheese. Regular salted crackers. Self-rising flour. Rolls. Bagels. Flour tortillas and wraps. Meats and other proteins Meat or fish that is salted, canned, smoked, cured, spiced, or pickled. This includes bacon, ham, sausages, hot dogs, corned beef, chipped beef, meat loaves, salt pork, jerky, pickled herring, anchovies, regular canned tuna, and sardines. Salted nuts. Dairy Processed cheese and cheese spreads. Cheese curds. Blue cheese. Feta cheese. String cheese. Regular cottage cheese. Buttermilk. Canned milk. The items listed above may not be a complete list of foods high in sodium. Actual amounts of sodium may be different depending on processing. Contact a dietitian for more information. What foods are low in sodium? Fruits Fresh, frozen, or canned fruit with no sauce added. Fruit juice. Vegetables Fresh or frozen vegetables with no sauce added. "No salt added" canned vegetables. "No salt added" tomato sauce and paste. Low-sodium or reduced-sodium tomato and vegetable juice. Grains Noodles, pasta, quinoa, rice. Shredded or puffed  wheat or puffed rice. Regular or quick oats (not instant). Low-sodium crackers. Low-sodium bread. Whole-grain bread and whole-grain pasta. Unsalted popcorn. Meats and other proteins Fresh or frozen whole meats, poultry (not injected with sodium), and fish with no  sauce added. Unsalted nuts. Dried peas, beans, and lentils without added salt. Unsalted canned beans. Eggs. Unsalted nut butters. Low-sodium canned tuna or chicken. Dairy Milk. Soy milk. Yogurt. Low-sodium cheeses, such as Swiss, [address], [place], and [employer]. Sherbet or ice cream (keep to  cup per serving). Cream cheese. Fats and oils Unsalted butter or margarine. Other foods Homemade pudding. Sodium-free baking soda and baking powder. Herbs and spices. Low-sodium seasoning mixes. Beverages Coffee and tea. Carbonated beverages. The items listed above may not be a complete list of foods low in sodium. Actual amounts of sodium may be different depending on processing. Contact a dietitian for more information. What are some salt alternatives when cooking? The following are herbs, seasonings, and spices that can be used instead of salt to flavor your food. Herbs should be fresh or dried. Do not choose packaged mixes. Next to the name of the herb, spice, or seasoning are some examples of foods you can pair it with. Herbs  Bay leaves - Soups, meat and vegetable dishes, and spaghetti sauce.  Basil - [employer], soups, pasta, and fish dishes.  Cilantro - Meat, poultry, and vegetable dishes.  Chili powder - Marinades and Mexican dishes.  Chives - Salad dressings and potato dishes.  Cumin - Mexican dishes, couscous, and meat dishes.  Dill - Fish dishes, sauces, and salads.  Fennel - Meat and vegetable dishes, breads, and cookies.  Garlic (do not use garlic salt) - [place] dishes, meat dishes, salad dressings, and sauces.  Marjoram - Soups, potato dishes, and meat dishes.  Oregano - Pizza and spaghetti sauce.  Parsley - Salads, soups, pasta, and meat dishes.  Rosemary - [place] dishes, salad dressings, soups, and red meats.  Saffron - Fish dishes, pasta, and some poultry dishes.  Sage - Stuffings and sauces.  Tarragon - Fish and Whole Foods.  Thyme - Stuffing,  meat, and fish dishes. Seasonings  Lemon juice - Fish dishes, poultry dishes, vegetables, and salads.  Vinegar - Salad dressings, vegetables, and fish dishes. Spices  Cinnamon - Sweet dishes, such as cakes, cookies, and puddings.  Cloves - Gingerbread, puddings, and marinades for meats.  Curry - Vegetable dishes, fish and poultry dishes, and stir-fry dishes.  Ginger - Vegetable dishes, fish dishes, and stir-fry dishes.  Nutmeg - Pasta, vegetables, poultry, fish dishes, and custard. Summary  Cooking with less salt is one way to reduce the amount of sodium that you get from food.  Buy sodium-free or low-sodium products.  Check the food label before using or buying packaged ingredients.  Use herbs, seasonings without salt, and spices as substitutes for salt in foods. This information is not intended to replace advice given to you by your health care provider. Make sure you discuss any questions you have with your health care provider. Document Revised: 10/14/2019 Document Reviewed: 10/14/2019 Elsevier Patient Education  2021 [employer] Inc.  DASH Eating Plan DASH stands for Dietary Approaches to Stop Hypertension. The DASH eating plan is a healthy eating plan that has been shown to:  Reduce high blood pressure (hypertension).  Reduce your risk for type 2 diabetes, heart disease, and stroke.  Help with weight loss. What are tips for following this plan? Reading food labels  Check food labels for the amount of salt (  sodium) per serving. Choose foods with less than 5 percent of the Daily Value of sodium. Generally, foods with less than 300 milligrams (mg) of sodium per serving fit into this eating plan.  To find whole grains, look for the word "whole" as the first word in the ingredient list. Shopping  Buy products labeled as "low-sodium" or "no salt added."  Buy fresh foods. Avoid canned foods and pre-made or frozen meals. Cooking  Avoid adding salt when cooking. Use  salt-free seasonings or herbs instead of table salt or sea salt. Check with your health care provider or pharmacist before using salt substitutes.  Do not fry foods. Cook foods using healthy methods such as baking, boiling, grilling, roasting, and broiling instead.  Cook with heart-healthy oils, such as olive, canola, avocado, soybean, or sunflower oil. Meal planning  Eat a balanced diet that includes: ? 4 or more servings of fruits and 4 or more servings of vegetables each day. Try to fill one-half of your plate with fruits and vegetables. ? 6-8 servings of whole grains each day. ? Less than 6 oz (170 g) of lean meat, poultry, or fish each day. A 3-oz (85-g) serving of meat is about the same size as a deck of cards. One egg equals 1 oz (28 g). ? 2-3 servings of low-fat dairy each day. One serving is 1 cup (237 mL). ? 1 serving of nuts, seeds, or beans 5 times each week. ? 2-3 servings of heart-healthy fats. Healthy fats called omega-3 fatty acids are found in foods such as walnuts, flaxseeds, fortified milks, and eggs. These fats are also found in cold-water fish, such as sardines, salmon, and mackerel.  Limit how much you eat of: ? Canned or prepackaged foods. ? Food that is high in trans fat, such as some fried foods. ? Food that is high in saturated fat, such as fatty meat. ? Desserts and other sweets, sugary drinks, and other foods with added sugar. ? Full-fat dairy products.  Do not salt foods before eating.  Do not eat more than 4 egg yolks a week.  Try to eat at least 2 vegetarian meals a week.  Eat more home-cooked food and less restaurant, buffet, and fast food.   Lifestyle  When eating at a restaurant, ask that your food be prepared with less salt or no salt, if possible.  If you drink alcohol: ? Limit how much you use to:  0-1 drink a day for women who are not pregnant.  0-2 drinks a day for men. ? Be aware of how much alcohol is in your drink. In the [place] one  drink equals one 12 oz bottle of beer (355 mL), one 5 oz glass of wine (148 mL), or one 1 oz glass of hard liquor (44 mL). General information  Avoid eating more than 2,300 mg of salt a day. If you have hypertension, you may need to reduce your sodium intake to 1,500 mg a day.  Work with your health care provider to maintain a healthy body weight or to lose weight. Ask what an ideal weight is for you.  Get at least 30 minutes of exercise that causes your heart to beat faster (aerobic exercise) most days of the week. Activities may include walking, swimming, or biking.  Work with your health care provider or dietitian to adjust your eating plan to your individual calorie needs. What foods should I eat? Fruits All fresh, dried, or frozen fruit. Canned fruit in natural juice (without added  sugar). Vegetables Fresh or frozen vegetables (raw, steamed, roasted, or grilled). Low-sodium or reduced-sodium tomato and vegetable juice. Low-sodium or reduced-sodium tomato sauce and tomato paste. Low-sodium or reduced-sodium canned vegetables. Grains Whole-grain or whole-wheat bread. Whole-grain or whole-wheat pasta. Brown rice. Orpah Cobb. Bulgur. Whole-grain and low-sodium cereals. Pita bread. Low-fat, low-sodium crackers. Whole-wheat flour tortillas. Meats and other proteins Skinless chicken or [place]. Ground chicken or [place]. Pork with fat trimmed off. Fish and seafood. Egg whites. Dried beans, peas, or lentils. Unsalted nuts, nut butters, and seeds. Unsalted canned beans. Lean cuts of beef with fat trimmed off. Low-sodium, lean precooked or cured meat, such as sausages or meat loaves. Dairy Low-fat (1%) or fat-free (skim) milk. Reduced-fat, low-fat, or fat-free cheeses. Nonfat, low-sodium ricotta or cottage cheese. Low-fat or nonfat yogurt. Low-fat, low-sodium cheese. Fats and oils Soft margarine without trans fats. Vegetable oil. Reduced-fat, low-fat, or light mayonnaise and salad dressings  (reduced-sodium). Canola, safflower, olive, avocado, soybean, and sunflower oils. Avocado. Seasonings and condiments Herbs. Spices. Seasoning mixes without salt. Other foods Unsalted popcorn and pretzels. Fat-free sweets. The items listed above may not be a complete list of foods and beverages you can eat. Contact a dietitian for more information. What foods should I avoid? Fruits Canned fruit in a light or heavy syrup. Fried fruit. Fruit in cream or butter sauce. Vegetables Creamed or fried vegetables. Vegetables in a cheese sauce. Regular canned vegetables (not low-sodium or reduced-sodium). Regular canned tomato sauce and paste (not low-sodium or reduced-sodium). Regular tomato and vegetable juice (not low-sodium or reduced-sodium). Rosita Fire. Olives. Grains Baked goods made with fat, such as croissants, muffins, or some breads. Dry pasta or rice meal packs. Meats and other proteins Fatty cuts of meat. Ribs. Fried meat. Tomasa Blase. Bologna, salami, and other precooked or cured meats, such as sausages or meat loaves. Fat from the back of a pig (fatback). Bratwurst. Salted nuts and seeds. Canned beans with added salt. Canned or smoked fish. Whole eggs or egg yolks. Chicken or [place] with skin. Dairy Whole or 2% milk, cream, and half-and-half. Whole or full-fat cream cheese. Whole-fat or sweetened yogurt. Full-fat cheese. Nondairy creamers. Whipped toppings. Processed cheese and cheese spreads. Fats and oils Butter. Stick margarine. Lard. Shortening. Ghee. Bacon fat. Tropical oils, such as coconut, palm kernel, or palm oil. Seasonings and condiments Onion salt, garlic salt, seasoned salt, table salt, and sea salt. Worcestershire sauce. Tartar sauce. Barbecue sauce. Teriyaki sauce. Soy sauce, including reduced-sodium. Steak sauce. Canned and packaged gravies. Fish sauce. Oyster sauce. Cocktail sauce. Store-bought horseradish. Ketchup. Mustard. Meat flavorings and tenderizers. Bouillon cubes. Hot sauces.  Pre-made or packaged marinades. Pre-made or packaged taco seasonings. Relishes. Regular salad dressings. Other foods Salted popcorn and pretzels. The items listed above may not be a complete list of foods and beverages you should avoid. Contact a dietitian for more information. Where to find more information  National Heart, Lung, and Blood Institute: PopSteam.is  American Heart Association: www.heart.org  Academy of Nutrition and Dietetics: www.eatright.org  National Kidney Foundation: www.kidney.org Summary  The DASH eating plan is a healthy eating plan that has been shown to reduce high blood pressure (hypertension). It may also reduce your risk for type 2 diabetes, heart disease, and stroke.  When on the DASH eating plan, aim to eat more fresh fruits and vegetables, whole grains, lean proteins, low-fat dairy, and heart-healthy fats.  With the DASH eating plan, you should limit salt (sodium) intake to 2,300 mg a day. If you have hypertension, you may need to  reduce your sodium intake to 1,500 mg a day.  Work with your health care provider or dietitian to adjust your eating plan to your individual calorie needs. This information is not intended to replace advice given to you by your health care provider. Make sure you discuss any questions you have with your health care provider. Document Revised: 09/25/2019 Document Reviewed: 09/25/2019 Elsevier Patient Education  2021 [employer]

## 2021-01-26 NOTE — Progress Notes (Signed)
Chief Complaint  Patient presents with   Annual Exam    Physical, no concerns verbalized from pt.    Annual  1. He is not sexually active currently so advised to stop truvada and need for serial STD monitoring he will call back when sexually active again and need for pill  2. Elevated BP repeat lower will check BP he was rushing from another office today    Review of Systems  Constitutional: Negative for weight loss.  HENT: Negative for ear pain and hearing loss.   Eyes: Negative for blurred vision.  Respiratory: Negative for shortness of breath.   Cardiovascular: Negative for chest pain.  Gastrointestinal: Negative for abdominal pain.  Musculoskeletal: Negative for falls and joint pain.  Skin: Negative for rash.  Neurological: Negative for headaches.   Past Medical History:  Diagnosis Date   Allergy    Asthma    childhood   Chicken pox    HNP (herniated nucleus pulposus), lumbar    Past Surgical History:  Procedure Laterality Date   BACK SURGERY     repair of ruptured disc L4/L5    LUMBAR LAMINECTOMY/DECOMPRESSION MICRODISCECTOMY Right 10/13/2015   Procedure: MICRO LUMBAR DECOMPRESSION L5 - S1 ON THE RIGHT, with specimen of l5-s1;  Surgeon: Jene Every, MD;  Location: [facility];  Service: Orthopedics;  Laterality: Right;   Family History  Problem Relation Age of Onset   Cancer Father        liver   Diabetes Father    Heart disease Father        chf   Early death Father        age 23    Hyperlipidemia Father    Hypertension Father    Stroke Father    Alcohol abuse Sister    Depression Sister    Drug abuse Sister    Arthritis Maternal Grandmother    Cancer Maternal Grandmother    Hyperlipidemia Maternal Grandmother    Hypertension Maternal Grandmother    Glaucoma Maternal Grandmother    Arthritis Maternal Grandfather    Heart disease Maternal Grandfather    Hyperlipidemia Maternal Grandfather    Hypertension Maternal Grandfather     Arthritis Paternal Grandfather    Depression Paternal Grandfather    Hyperlipidemia Paternal Grandfather    Heart disease Paternal Grandfather    Hypertension Paternal Grandfather    Stroke Paternal Grandfather    Social History   Socioeconomic History   Marital status: Legally Separated    Spouse name: Not on file   Number of children: Not on file   Years of education: Not on file   Highest education level: Not on file  Occupational History   Not on file  Tobacco Use   Smoking status: Never Smoker   Smokeless tobacco: Never Used  Substance and Sexual Activity   Alcohol use: Yes    Comment: occasional wine   Drug use: No   Sexual activity: Yes  Other Topics Concern   Not on file  Social History Narrative   divorced, wife    2 kids  79 y.o daughter 8 y.o son    Psychologist, 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   Allergies  Allergen Reactions   Amoxicillin Other (See Comments)    Upset stomach   Recent Results (from the past 2160 hour(s))  Hepatitis B Surface AntiGEN     Status: None   Collection Time: 12/02/20  8:08 AM  Result Value Ref Range   Hepatitis B Surface Ag NON-REACTIVE NON-REACTI  Hepatitis C antibody     Status: None   Collection Time: 12/02/20  8:08 AM  Result Value Ref Range   Hepatitis C Ab NON-REACTIVE NON-REACTI   SIGNAL TO CUT-OFF 0.01 <1.00    Comment: . HCV antibody was non-reactive. There is no laboratory  evidence of HCV infection. . In most cases, no further action is  required. However, if recent HCV exposure is suspected, a test for HCV RNA (test code 97416) is suggested. . For additional information please refer to http://education.[facility].com/faq/FAQ22v1 (This link is being provided for informational/ educational purposes only.) .   HIV antibody (with reflex)     Status: None   Collection Time: 12/02/20  8:08 AM  Result Value Ref Range   HIV 1&2 Ab, 4th Generation NON-REACTIVE NON-REACTI    Comment: HIV-1 antigen and HIV-1/HIV-2 antibodies were not detected. There is no laboratory evidence of HIV infection. Marland Kitchen PLEASE NOTE: This information has been disclosed to you from records whose confidentiality may be protected by state law.  If your state requires such protection, then the state law prohibits you from making any further disclosure of the information without the specific written consent of the person to whom it pertains, or as otherwise permitted by law. A general authorization for the release of medical or other information is NOT sufficient for this purpose. . For additional information please refer to http://education.[facility].com/faq/FAQ106 (This link is being provided for informational/ educational purposes only.) . Marland Kitchen The performance of this assay has not been clinically validated in patients less than 29 years old. .   RPR     Status: None   Collection Time: 12/02/20  8:08 AM  Result Value Ref Range   RPR Ser Ql NON-REACTIVE NON-REACTI  Lipid panel     Status: None   Collection Time: 12/02/20  8:08 AM  Result Value Ref Range   Cholesterol 153 0 - 200 mg/dL    Comment: ATP III Classification       Desirable:  < 200 mg/dL               Borderline High:  200 - 239 mg/dL          High:  > = 384 mg/dL   Triglycerides 53.6 0.0 - 149.0 mg/dL    Comment: Normal:  <468 mg/dLBorderline High:  150 - 199 mg/dL   HDL 03.21 >22.48 mg/dL   VLDL 25.0 0.0 - 03.7 mg/dL   LDL Cholesterol 95 0 - 99 mg/dL   Total  CHOL/HDL Ratio 4     Comment:                Men          Women1/2 Average Risk     3.4          3.3Average Risk          5.0          4.42X Average Risk  9.6          7.13X Average Risk          15.0          11.0                       NonHDL 110.02     Comment: NOTE:  Non-HDL goal should be 30 mg/dL higher than patient's LDL goal (i.e. LDL goal of < 70 mg/dL, would have non-HDL goal of < 100 mg/dL)  Comprehensive metabolic panel     Status: Abnormal   Collection Time: 12/02/20  8:08 AM  Result Value Ref Range   Sodium 136 135 - 145 mEq/L   Potassium 5.0 3.5 - 5.1 mEq/L   Chloride 104 96 - 112 mEq/L   CO2 27 19 - 32 mEq/L   Glucose, Bld 105 (H) 70 - 99 mg/dL   BUN 21 6 - 23 mg/dL   Creatinine, Ser 9.47 0.40 - 1.50 mg/dL   Total Bilirubin 0.4 0.2 - 1.2 mg/dL   Alkaline Phosphatase 51 39 - 117 U/L   AST 29 0 - 37 U/L   ALT 49 0 - 53 U/L   Total Protein 6.9 6.0 - 8.3 g/dL   Albumin 4.3 3.5 - 5.2 g/dL   GFR 09.62 >83.66 mL/min    Comment: Calculated using the CKD-EPI Creatinine Equation (2021)   Calcium 9.2 8.4 - 10.5 mg/dL  Urine cytology ancillary only([place])     Status: None   Collection Time: 12/02/20  8:08 AM  Result Value Ref Range   Trichomonas Negative    Chlamydia Negative    Neisseria Gonorrhea Negative    Comment Normal Reference Range Trichomonas - Negative    Comment Normal Reference [place] Chlamydia - Negative    Comment      Normal Reference Range Neisseria Gonorrhea - Negative   Objective  Body mass index is 30.86 kg/m. Wt Readings from Last 3 Encounters:  01/26/21 240 lb 6 oz (109 kg)  10/05/20 236 lb 9.6 oz (107.3 kg)  07/28/20 229 lb 1.9 oz (103.9 kg)   Temp Readings from Last 3 Encounters:  10/05/20 98.1 F (36.7 C) (Oral)  07/28/20 98.5 F (36.9 C) (Oral)  01/28/20 98.5 F (36.9 C) (Temporal)   BP Readings from Last 3 Encounters:  01/26/21 138/82  10/05/20 122/82  07/28/20 138/80   Pulse Readings from Last 3 Encounters:  01/26/21  76  10/05/20 74  07/28/20 (!) 101    Physical Exam Vitals and nursing note reviewed.  Constitutional:      Appearance: Normal appearance. He is well-developed and well-groomed. He is obese.  HENT:     Head: Normocephalic and atraumatic.  Eyes:     Conjunctiva/sclera: Conjunctivae normal.     Pupils: Pupils are equal, round, and reactive to light.  Cardiovascular:     Rate and Rhythm: Normal rate and regular rhythm.     Heart sounds: Normal heart sounds. No murmur heard.   Pulmonary:     Effort: Pulmonary effort is normal.     Breath sounds: Normal breath sounds.  Abdominal:     Tenderness: There is no abdominal tenderness.  Skin:    General: Skin is warm and dry.  Neurological:     General: No focal deficit present.     Mental Status: He is alert and oriented to person, place, and time. Mental status is at baseline.     Gait: Gait normal.  Psychiatric:  Attention and Perception: Attention and perception normal.        Mood and Affect: Mood and affect normal.        Speech: Speech normal.        Behavior: Behavior normal. Behavior is cooperative.        Thought Content: Thought content normal.        Cognition and Memory: Cognition and memory normal.        Judgment: Judgment normal.     Assessment  Plan  Annual physical exam Fasting labs in future at least by 2023  Did not have flu shot, declines flu shot utdTdap moderna 2/2consider booster  Hep B immune  STD check+ HSV 1 pt aware Eye MD Heather [place] in [place] saw 2018 eye exam 11/2020 Rx updated wears contacts FH glaucoma [facility] [place]  Colonoscopy disc age 40  rec healthy diet and exercise   Elevated BP without diagnosis of hypertension  Monitor BP could be due to stress today and being late 5 min which he does not like Monitor BP at home Provider: Dr. French Anaracy McLean-Scocuzza-Internal Medicine

## 2021-01-27 DIAGNOSIS — Z0000 Encounter for general adult medical examination without abnormal findings: Secondary | ICD-10-CM | POA: Insufficient documentation

## 2021-01-27 MED ORDER — CETIRIZINE HCL 10 MG PO TABS: 10.0000 mg | ORAL_TABLET | Freq: Every day | ORAL | 3 refills | Status: DC | PRN

## 2021-01-27 MED ORDER — ALBUTEROL SULFATE HFA 108 (90 BASE) MCG/ACT IN AERS: 1.0000 | INHALATION_SPRAY | Freq: Four times a day (QID) | RESPIRATORY_TRACT | 11 refills | Status: DC | PRN

## 2021-01-27 MED ORDER — AZELASTINE HCL 0.1 % NA SOLN: 2.0000 | Freq: Every day | NASAL | 11 refills | Status: DC

## 2021-01-27 MED ORDER — FLUTICASONE PROPIONATE 50 MCG/ACT NA SUSP: 2.0000 | Freq: Every day | NASAL | 11 refills | Status: DC

## 2021-02-03 ENCOUNTER — Other Ambulatory Visit (HOSPITAL_COMMUNITY): Payer: Self-pay

## 2021-02-09 ENCOUNTER — Other Ambulatory Visit (HOSPITAL_COMMUNITY): Payer: Self-pay

## 2021-02-15 ENCOUNTER — Other Ambulatory Visit (HOSPITAL_COMMUNITY): Payer: Self-pay

## 2021-02-15 MED FILL — Emtricitabine-Tenofovir Disoproxil Fumarate Tab 200-300 MG: ORAL | 30 days supply | Qty: 30 | Fill #0 | Status: AC

## 2021-02-16 ENCOUNTER — Other Ambulatory Visit (HOSPITAL_COMMUNITY): Payer: Self-pay

## 2021-02-20 ENCOUNTER — Other Ambulatory Visit (HOSPITAL_COMMUNITY): Payer: Self-pay

## 2021-03-20 ENCOUNTER — Other Ambulatory Visit (HOSPITAL_COMMUNITY): Payer: Self-pay

## 2021-03-22 ENCOUNTER — Other Ambulatory Visit (HOSPITAL_COMMUNITY): Payer: Self-pay

## 2021-03-28 ENCOUNTER — Other Ambulatory Visit (HOSPITAL_COMMUNITY): Payer: Self-pay

## 2021-03-28 MED FILL — Emtricitabine-Tenofovir Disoproxil Fumarate Tab 200-300 MG: ORAL | 30 days supply | Qty: 30 | Fill #1 | Status: AC

## 2021-04-27 ENCOUNTER — Other Ambulatory Visit (HOSPITAL_COMMUNITY): Payer: Self-pay

## 2021-04-27 MED FILL — Emtricitabine-Tenofovir Disoproxil Fumarate Tab 200-300 MG: ORAL | 30 days supply | Qty: 30 | Fill #2 | Status: AC

## 2021-05-03 ENCOUNTER — Other Ambulatory Visit (HOSPITAL_COMMUNITY): Payer: Self-pay

## 2021-05-29 ENCOUNTER — Other Ambulatory Visit (HOSPITAL_COMMUNITY): Payer: Self-pay

## 2021-05-29 MED FILL — Emtricitabine-Tenofovir Disoproxil Fumarate Tab 200-300 MG: ORAL | 30 days supply | Qty: 30 | Fill #3 | Status: AC

## 2021-06-05 ENCOUNTER — Other Ambulatory Visit (HOSPITAL_COMMUNITY): Payer: Self-pay

## 2021-07-05 ENCOUNTER — Other Ambulatory Visit (HOSPITAL_COMMUNITY): Payer: Self-pay

## 2021-07-14 ENCOUNTER — Other Ambulatory Visit (HOSPITAL_COMMUNITY): Payer: Self-pay

## 2021-07-14 MED FILL — Emtricitabine-Tenofovir Disoproxil Fumarate Tab 200-300 MG: ORAL | 30 days supply | Qty: 30 | Fill #4 | Status: AC

## 2021-07-17 ENCOUNTER — Other Ambulatory Visit (HOSPITAL_COMMUNITY): Payer: Self-pay

## 2021-08-11 ENCOUNTER — Other Ambulatory Visit (HOSPITAL_COMMUNITY): Payer: Self-pay

## 2021-08-11 MED FILL — Emtricitabine-Tenofovir Disoproxil Fumarate Tab 200-300 MG: ORAL | 30 days supply | Qty: 30 | Fill #5 | Status: AC

## 2021-08-17 ENCOUNTER — Other Ambulatory Visit (HOSPITAL_COMMUNITY): Payer: Self-pay

## 2021-09-05 ENCOUNTER — Other Ambulatory Visit (HOSPITAL_COMMUNITY): Payer: Self-pay

## 2021-09-11 ENCOUNTER — Other Ambulatory Visit (HOSPITAL_COMMUNITY): Payer: Self-pay

## 2021-09-15 ENCOUNTER — Other Ambulatory Visit (HOSPITAL_COMMUNITY): Payer: Self-pay

## 2021-09-15 MED FILL — Emtricitabine-Tenofovir Disoproxil Fumarate Tab 200-300 MG: ORAL | 30 days supply | Qty: 30 | Fill #6 | Status: AC

## 2021-09-19 ENCOUNTER — Other Ambulatory Visit (HOSPITAL_COMMUNITY): Payer: Self-pay

## 2021-09-21 ENCOUNTER — Other Ambulatory Visit (HOSPITAL_COMMUNITY): Payer: Self-pay

## 2021-10-23 ENCOUNTER — Other Ambulatory Visit (HOSPITAL_COMMUNITY): Payer: Self-pay

## 2021-10-23 ENCOUNTER — Ambulatory Visit: Payer: BLUE CROSS/BLUE SHIELD | Admitting: Adult Health

## 2021-10-23 ENCOUNTER — Other Ambulatory Visit: Payer: Self-pay | Admitting: Internal Medicine

## 2021-10-23 DIAGNOSIS — Z7253 High risk bisexual behavior: Secondary | ICD-10-CM

## 2021-10-23 DIAGNOSIS — Z79899 Other long term (current) drug therapy: Secondary | ICD-10-CM

## 2021-10-23 MED ORDER — EMTRICITABINE-TENOFOVIR DF 200-300 MG PO TABS
1.0000 | ORAL_TABLET | Freq: Every day | ORAL | 3 refills | Status: DC
  Filled 2021-10-23: qty 30, 30d supply, fill #0
  Filled 2021-11-21: qty 30, 30d supply, fill #1
  Filled 2021-12-29: qty 30, 30d supply, fill #2
  Filled 2022-01-29: qty 30, 30d supply, fill #3
  Filled 2022-02-28: qty 30, 30d supply, fill #4
  Filled 2022-04-05: qty 30, 30d supply, fill #5
  Filled 2022-05-03: qty 30, 30d supply, fill #6
  Filled 2022-05-31: qty 30, 30d supply, fill #7
  Filled 2022-07-16: qty 30, 30d supply, fill #8
  Filled 2022-08-14: qty 30, 30d supply, fill #9
  Filled 2022-09-12: qty 30, 30d supply, fill #10

## 2021-10-25 ENCOUNTER — Other Ambulatory Visit (HOSPITAL_COMMUNITY): Payer: Self-pay

## 2021-11-03 ENCOUNTER — Other Ambulatory Visit: Payer: Self-pay

## 2021-11-03 ENCOUNTER — Ambulatory Visit (INDEPENDENT_AMBULATORY_CARE_PROVIDER_SITE_OTHER): Payer: BLUE CROSS/BLUE SHIELD | Admitting: Internal Medicine

## 2021-11-03 ENCOUNTER — Encounter: Payer: Self-pay | Admitting: Internal Medicine

## 2021-11-03 VITALS — BP 130/70 | HR 75 | Temp 97.6°F | Resp 16 | Ht 74.0 in | Wt 215.6 lb

## 2021-11-03 DIAGNOSIS — F4323 Adjustment disorder with mixed anxiety and depressed mood: Secondary | ICD-10-CM

## 2021-11-03 DIAGNOSIS — G4700 Insomnia, unspecified: Secondary | ICD-10-CM | POA: Diagnosis not present

## 2021-11-03 DIAGNOSIS — Z1329 Encounter for screening for other suspected endocrine disorder: Secondary | ICD-10-CM

## 2021-11-03 DIAGNOSIS — Z113 Encounter for screening for infections with a predominantly sexual mode of transmission: Secondary | ICD-10-CM

## 2021-11-03 DIAGNOSIS — E559 Vitamin D deficiency, unspecified: Secondary | ICD-10-CM | POA: Diagnosis not present

## 2021-11-03 DIAGNOSIS — R739 Hyperglycemia, unspecified: Secondary | ICD-10-CM

## 2021-11-03 DIAGNOSIS — Z0000 Encounter for general adult medical examination without abnormal findings: Secondary | ICD-10-CM

## 2021-11-03 DIAGNOSIS — Z1389 Encounter for screening for other disorder: Secondary | ICD-10-CM

## 2021-11-03 MED ORDER — BUPROPION HCL ER (XL) 150 MG PO TB24: 150.0000 mg | ORAL_TABLET | Freq: Every day | ORAL | 3 refills | Status: DC

## 2021-11-03 NOTE — Progress Notes (Signed)
Chief Complaint  Patient presents with   Follow-up   F/u  1. 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 Prep use and due fasting labs   Review of Systems  Constitutional:  Negative for weight loss.  HENT:  Negative for hearing loss.   Eyes:  Negative for blurred vision.  Respiratory:  Negative for shortness of breath.   Cardiovascular:  Negative for chest pain.  Gastrointestinal:  Negative for abdominal pain and blood in stool.  Musculoskeletal:  Negative for back pain.  Skin:  Negative for rash.  Neurological:  Negative for headaches.  Psychiatric/Behavioral:  Positive for depression. The patient has insomnia. The patient is not nervous/anxious.   Past Medical History:  Diagnosis Date   Allergy    Asthma    childhood   Chicken pox    HNP (herniated nucleus pulposus), lumbar    URI (upper respiratory infection)    09/2021 given Augmentin went to [facility] [address] better as of 11/03/21   Past Surgical History:  Procedure Laterality Date   BACK SURGERY     repair of ruptured disc L4/L5    LUMBAR LAMINECTOMY/DECOMPRESSION MICRODISCECTOMY Right 10/13/2015   Procedure: MICRO LUMBAR DECOMPRESSION L5 - S1 ON THE RIGHT, with specimen of l5-s1;  Surgeon: Jene Every, MD;  Location: [facility];  Service: Orthopedics;  Laterality: Right;   Family History  Problem Relation Age of Onset   Cancer Father        liver   Diabetes Father    Heart disease Father        chf   Early death Father        age 73    Hyperlipidemia Father    Hypertension Father    Stroke Father    Alcohol abuse Sister    Depression Sister    Drug abuse Sister    Arthritis Maternal Grandmother    Cancer Maternal  Grandmother    Hyperlipidemia Maternal Grandmother    Hypertension Maternal Grandmother    Glaucoma Maternal Grandmother    Arthritis Maternal Grandfather    Heart disease Maternal Grandfather    Hyperlipidemia Maternal Grandfather    Hypertension Maternal Grandfather    Arthritis Paternal Grandfather    Depression Paternal Grandfather    Hyperlipidemia Paternal Grandfather    Heart disease Paternal Grandfather    Hypertension Paternal Grandfather    Stroke Paternal Grandfather    Social History   Socioeconomic History   Marital status: Legally Separated    Spouse name: Not on file   Number of children: Not on file   Years of education: Not on file   Highest education level: Not on file  Occupational History   Not on file  Tobacco Use   Smoking status: Never   Smokeless tobacco: Never  Substance and Sexual Activity   Alcohol use: Yes    Comment: occasional wine   Drug use: No   Sexual activity: Yes  Other Topics Concern   Not on file  Social History Narrative   divorced, wife 04/2020   2 kids  7 y.o daughter 10 y.o son    Psychologist, 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   No Active Allergies  No results found for this or any previous visit (from the past 2160 hour(s)). Objective  Body mass index is 27.68 kg/m. Wt Readings from Last 3 Encounters:  11/03/21 215 lb 9.6 oz (97.8 kg)  01/26/21 240 lb 6 oz (109 kg)  10/05/20 236 lb 9.6 oz (107.3 kg)   Temp Readings from Last 3  Encounters:  11/03/21 97.6 F (36.4 C)  10/05/20 98.1 F (36.7 C) (Oral)  07/28/20 98.5 F (36.9 C) (Oral)   BP Readings from Last 3 Encounters:  11/03/21 130/70  01/26/21 138/82  10/05/20 122/82   Pulse Readings from Last 3 Encounters:  11/03/21 75  01/26/21 76  10/05/20 74    Physical Exam Vitals and nursing note reviewed.  Constitutional:      Appearance: Normal appearance. He is well-developed and well-groomed. He is obese.  HENT:     Head: Normocephalic and atraumatic.  Eyes:     Conjunctiva/sclera: Conjunctivae normal.     Pupils: Pupils are equal, round, and reactive to light.  Cardiovascular:     Rate and Rhythm: Normal rate and regular rhythm.     Heart sounds: Normal heart sounds.  Pulmonary:     Effort: Pulmonary effort is normal. No respiratory distress.     Breath sounds: Normal breath sounds.  Abdominal:     Tenderness: There is no abdominal tenderness.  Musculoskeletal:     Lumbar back: Tenderness present. Negative right straight leg raise test and negative left straight leg raise test.  Skin:    General: Skin is warm and moist.  Neurological:     General: No focal deficit present.     Mental Status: He is alert and oriented to person, place, and time. Mental status is at baseline.     Sensory: Sensation is intact.     Motor: Motor function is intact.     Coordination: Coordination is intact.     Gait: Gait is intact. Gait normal.  Psychiatric:        Attention and Perception: Attention and perception normal.        Mood and Affect: Mood and affect normal.        Speech: Speech normal.        Behavior: Behavior normal. Behavior is cooperative.        Thought Content: Thought content normal.        Cognition and Memory: Cognition and memory normal.        Judgment: Judgment normal.    Assessment  Plan  Adjustment disorder with mixed anxiety and depressed mood - Plan: buPROPion (WELLBUTRIN XL) 150 MG 24 hr tablet Insomnia, unspecified  type Stress relax brand tranquil sleep   [facility] fasting labs Did not have flu shot, declines flu shot utd Tdap  moderna 3/3  consider booster 4th dose  Hep B immune    STD check +  HSV 1 pt aware  Eye MD Heather [place] in [place] saw 2018 eye exam 11/2020 Rx updated wears contacts FH glaucoma Dentist Everardo [place] >changed to [facility]  Colonoscopy disc age 19  rec healthy diet and exercise    Provider: Dr. French Ana McLean-Scocuzza-Internal Medicine

## 2021-11-03 NOTE — Patient Instructions (Addendum)
Consider 4th covid 19 shot   Stress Relax (brand)  Tranquil Sleep   Bupropion Extended-Release Tablets (Depression/Mood Disorders) What is this medication? BUPROPION (byoo PROE pee on) treats depression. It increases norepinephrine and dopamine in the brain, hormones that help regulate mood. It belongs to a group of medications called NDRIs. This medicine may be used for other purposes; ask your health care provider or pharmacist if you have questions. COMMON BRAND NAME(S): Aplenzin, Budeprion XL, Forfivo XL, Wellbutrin XL What should I tell my care team before I take this medication? They need to know if you have any of these conditions: An eating disorder, such as anorexia or bulimia Bipolar disorder or psychosis Diabetes or high blood sugar, treated with medication Glaucoma Head injury or brain tumor Heart disease, previous heart attack, or irregular heart beat High blood pressure Kidney or liver disease Seizures (convulsions) Suicidal thoughts or a previous suicide attempt Tourette's syndrome Weight loss An unusual or allergic reaction to bupropion, other medications, foods, dyes, or preservatives Pregnant or trying to become pregnant Breast-feeding How should I use this medication? Take this medication by mouth with a glass of water. Follow the directions on the prescription label. You can take it with or without food. If it upsets your stomach, take it with food. Do not crush, chew, or cut these tablets. This medication is taken once daily at the same time each day. Do not take your medication more often than directed. Do not stop taking this medication suddenly except upon the advice of your care team. Stopping this medication too quickly may cause serious side effects or your condition may worsen. A special MedGuide will be given to you by the pharmacist with each prescription and refill. Be sure to read this information carefully each time. Talk to your care team about the use  of this medication in children. Special care may be needed. Overdosage: If you think you have taken too much of this medicine contact a poison control center or emergency room at once. NOTE: This medicine is only for you. Do not share this medicine with others. What if I miss a dose? If you miss a dose, skip the missed dose and take your next tablet at the regular time. Do not take double or extra doses. What may interact with this medication? Do not take this medication with any of the following: Linezolid MAOIs like Azilect, Carbex, Eldepryl, Marplan, Nardil, and Parnate Methylene blue (injected into a vein) Other medications that contain bupropion like Zyban This medication may also interact with the following: Alcohol Certain medications for anxiety or sleep Certain medications for blood pressure like metoprolol, propranolol Certain medications for depression or psychotic disturbances Certain medications for HIV or AIDS like efavirenz, lopinavir, nelfinavir, ritonavir Certain medications for irregular heart beat like propafenone, flecainide Certain medications for Parkinson's disease like amantadine, levodopa Certain medications for seizures like carbamazepine, phenytoin, phenobarbital Cimetidine Clopidogrel Cyclophosphamide Digoxin Furazolidone Isoniazid Nicotine Orphenadrine Procarbazine Steroid medications like prednisone or cortisone Stimulant medications for attention disorders, weight loss, or to stay awake Tamoxifen Theophylline Thiotepa Ticlopidine Tramadol Warfarin This list may not describe all possible interactions. Give your health care provider a list of all the medicines, herbs, non-prescription drugs, or dietary supplements you use. Also tell them if you smoke, drink alcohol, or use illegal drugs. Some items may interact with your medicine. What should I watch for while using this medication? Tell your care team if your symptoms do not get better or if they  get worse. Visit  your care team for regular checks on your progress. Because it may take several weeks to see the full effects of this medication, it is important to continue your treatment as prescribed. Watch for new or worsening thoughts of suicide or depression. This includes sudden changes in mood, behavior, or thoughts. These changes can happen at any time but are more common in the beginning of treatment or after a change in dose. Call your care team right away if you experience these thoughts or worsening depression. Manic episodes may happen in patients with bipolar disorder who take this medication. Watch for changes in feelings or behaviors such as feeling anxious, nervous, agitated, panicky, irritable, hostile, aggressive, impulsive, severely restless, overly excited and hyperactive, or trouble sleeping. These symptoms can happen at anytime but are more common in the beginning of treatment or after a change in dose. Call your care team right away if you notice any of these symptoms. This medication may cause serious skin reactions. They can happen weeks to months after starting the medication. Contact your care team right away if you notice fevers or flu-like symptoms with a rash. The rash may be red or purple and then turn into blisters or peeling of the skin. Or, you might notice a red rash with swelling of the face, lips or lymph nodes in your neck or under your arms. Avoid drinks that contain alcohol while taking this medication. Drinking large amounts of alcohol, using sleeping or anxiety medications, or quickly stopping the use of these agents while taking this medication may increase your risk for a seizure. Do not drive or use heavy machinery until you know how this medication affects you. This medication can impair your ability to perform these tasks. Do not take this medication close to bedtime. It may prevent you from sleeping. Your mouth may get dry. Chewing sugarless gum or sucking  hard candy, and drinking plenty of water may help. Contact your care team if the problem does not go away or is severe. The tablet shell for some brands of this medication does not dissolve. This is normal. The tablet shell may appear whole in the stool. This is not a cause for concern. What side effects may I notice from receiving this medication? Side effects that you should report to your care team as soon as possible: Allergic reactions--skin rash, itching, hives, swelling of the face, lips, tongue, or throat Increase in blood pressure Mood and behavior changes--anxiety, nervousness, confusion, hallucinations, irritability, hostility, thoughts of suicide or self-harm, worsening mood, feelings of depression Redness, blistering, peeling, or loosening of the skin, including inside the mouth Seizures Sudden eye pain or change in vision such as blurry vision, seeing halos around lights, vision loss Side effects that usually do not require medical attention (report to your care team if they continue or are bothersome): Constipation Dizziness Dry mouth Loss of appetite Nausea Tremors or shaking Trouble sleeping This list may not describe all possible side effects. Call your doctor for medical advice about side effects. You may report side effects to FDA at 1-800-FDA-1088. Where should I keep my medication? Keep out of the reach of children and pets. Store at room temperature between 15 and 30 degrees C (59 and 86 degrees F). Throw away any unused medication after the expiration date. NOTE: This sheet is a summary. It may not cover all possible information. If you have questions about this medicine, talk to your doctor, pharmacist, or health care provider.  2022 Elsevier/Gold Standard (2021-01-04 00:00:00)  Adjustment Disorder, Adult Adjustment disorder is a group of symptoms that can develop after a stressful life event, such as the loss of a job or a serious physical illness. The symptoms  can affect how you feel, think, and act. They may also interfere with your relationships. Adjustment disorder increases your risk of suicide and substance abuse. If adjustment disorder is not managed early, it can make medical conditions that you already have worse. If the stressful life event persists, the disorder may continue and become a persistent form of adjustment disorder. What are the causes? This condition is caused by difficulty recovering from or coping with a stressful life event. What increases the risk? You are more likely to develop this condition if: You have had previous problems coping with life stressors. You are being treated for a long-term (chronic) illness. You are being treated for an illness that cannot be cured (terminal illness). You have a family history of mental illness. What are the signs or symptoms? Symptoms of this condition include: Behavioral symptoms such as: Trouble doing daily tasks. Reckless driving. Poor work Systems analyst. Ignoring bills. Avoiding family and friends. Impulsive actions. Emotional symptoms such as: Sadness, depression, or crying spells. Worrying a lot, or feeling nervous or anxious. Loss of enjoyment. Feelings of loss or hopelessness. Irritability. Thoughts of suicide. Physical symptoms such as: Change in appetite or weight. Complaining of feeling sick without being ill. Feeling dazed or disconnected. Nightmares. Trouble sleeping. Symptoms of this condition start within 3 months of the stressful event. They do not last more than 6 months, unless the stressful circumstances last longer. Normal grieving after the death of a loved one is not a symptom of this condition. How is this diagnosed? To diagnose this condition, your health care provider will ask about what has happened in your life and how it has affected you. He or she may also ask about your medical history and your use of medicines, alcohol, and other substances. Your  health care provider may do a physical exam and order lab tests or other studies. You may be referred to a mental health specialist. How is this treated? Treatment options for this condition include: Counseling or talk therapy. Talk therapy is usually provided by mental health specialists. This therapy may be individual or may involve family members. Medicines. Certain medicines may help with depression, anxiety, and sleep. Support groups. These offer emotional support, advice, and guidance. They are made up of people who have had similar experiences. Observation and time. This is sometimes called watchful waiting. In this treatment, health care providers monitor your health and behavior without other treatment. Adjustment disorder sometimes gets better on its own with time. Follow these instructions at home: Take over-the-counter and prescription medicines only as told by your health care provider. Keep all follow-up visits. This is important. Contact trusted family and friends for support. Let them know what is going on with you and how they can help. Contact a health care provider if: Your symptoms do not improve in 6 months. Your symptoms get worse. Get help right away if: You have serious thoughts about hurting yourself or someone else. If you ever feel like you may hurt yourself or others, or have thoughts about taking your own life, get help right away. Go to your nearest emergency department or: Call your local emergency services (911 in the [place] Call a suicide crisis helpline, such as the [place] at (419) 216-3335 or 988 in the [place]. This is open 24 hours  a day in the [place] Text the Crisis Text Line at 279-199-0966 (in the [place] Summary Adjustment disorder is a group of symptoms that can develop after a stressful life event, such as the loss of a job or a serious physical illness. The symptoms can affect how you feel, think, and act. They may interfere with your  relationships. Symptoms of this condition start within 3 months of the stressful event. They do not last more than 6 months, unless the stressful circumstances last longer. Treatment may include talk therapy, medicines, participation in a support group, or observation to see if symptoms improve. Contact your health care provider if your symptoms get worse or do not improve in 6 months. If you ever feel like you may hurt yourself or others, or have thoughts about taking your own life, get help right away. This information is not intended to replace advice given to you by your health care provider. Make sure you discuss any questions you have with your health care provider. Document Revised: 05/17/2021 Document Reviewed: 03/04/2020 Elsevier Patient Education  [place].

## 2021-11-07 ENCOUNTER — Encounter: Payer: Self-pay | Admitting: Internal Medicine

## 2021-11-21 ENCOUNTER — Other Ambulatory Visit (HOSPITAL_COMMUNITY): Payer: Self-pay

## 2021-11-27 ENCOUNTER — Other Ambulatory Visit (HOSPITAL_COMMUNITY): Payer: Self-pay

## 2021-12-04 ENCOUNTER — Other Ambulatory Visit: Payer: Self-pay

## 2021-12-04 ENCOUNTER — Other Ambulatory Visit (INDEPENDENT_AMBULATORY_CARE_PROVIDER_SITE_OTHER): Payer: BLUE CROSS/BLUE SHIELD

## 2021-12-04 ENCOUNTER — Inpatient Hospital Stay (HOSPITAL_COMMUNITY): Admit: 2021-12-04 | Payer: BLUE CROSS/BLUE SHIELD

## 2021-12-04 DIAGNOSIS — Z0000 Encounter for general adult medical examination without abnormal findings: Secondary | ICD-10-CM | POA: Diagnosis not present

## 2021-12-04 DIAGNOSIS — Z1329 Encounter for screening for other suspected endocrine disorder: Secondary | ICD-10-CM

## 2021-12-04 DIAGNOSIS — E559 Vitamin D deficiency, unspecified: Secondary | ICD-10-CM

## 2021-12-04 DIAGNOSIS — R739 Hyperglycemia, unspecified: Secondary | ICD-10-CM | POA: Diagnosis not present

## 2021-12-04 DIAGNOSIS — Z113 Encounter for screening for infections with a predominantly sexual mode of transmission: Secondary | ICD-10-CM | POA: Diagnosis not present

## 2021-12-04 DIAGNOSIS — Z1389 Encounter for screening for other disorder: Secondary | ICD-10-CM

## 2021-12-04 LAB — CBC WITH DIFFERENTIAL/PLATELET
Basophils Absolute: 0 10*3/uL (ref 0.0–0.1)
Basophils Relative: 0.5 % (ref 0.0–3.0)
Eosinophils Absolute: 0.2 10*3/uL (ref 0.0–0.7)
Eosinophils Relative: 2.9 % (ref 0.0–5.0)
HCT: 43 % (ref 39.0–52.0)
Hemoglobin: 14.1 g/dL (ref 13.0–17.0)
Lymphocytes Relative: 19 % (ref 12.0–46.0)
Lymphs Abs: 1.5 10*3/uL (ref 0.7–4.0)
MCHC: 32.9 g/dL (ref 30.0–36.0)
MCV: 92.1 fl (ref 78.0–100.0)
Monocytes Absolute: 0.5 10*3/uL (ref 0.1–1.0)
Monocytes Relative: 6.1 % (ref 3.0–12.0)
Neutro Abs: 5.8 10*3/uL (ref 1.4–7.7)
Neutrophils Relative %: 71.5 % (ref 43.0–77.0)
Platelets: 205 10*3/uL (ref 150.0–400.0)
RBC: 4.68 Mil/uL (ref 4.22–5.81)
RDW: 13.8 % (ref 11.5–15.5)
WBC: 8.1 10*3/uL (ref 4.0–10.5)

## 2021-12-04 LAB — COMPREHENSIVE METABOLIC PANEL
ALT: 17 U/L (ref 0–53)
AST: 14 U/L (ref 0–37)
Albumin: 4.4 g/dL (ref 3.5–5.2)
Alkaline Phosphatase: 55 U/L (ref 39–117)
BUN: 17 mg/dL (ref 6–23)
CO2: 28 mEq/L (ref 19–32)
Calcium: 9.2 mg/dL (ref 8.4–10.5)
Chloride: 102 mEq/L (ref 96–112)
Creatinine, Ser: 1.09 mg/dL (ref 0.40–1.50)
GFR: 84.75 mL/min (ref >60.00)
Glucose, Bld: 96 mg/dL (ref 70–99)
Potassium: 4.7 mEq/L (ref 3.5–5.1)
Sodium: 138 mEq/L (ref 135–145)
Total Bilirubin: 0.5 mg/dL (ref 0.2–1.2)
Total Protein: 6.8 g/dL (ref 6.0–8.3)

## 2021-12-04 LAB — LIPID PANEL
Cholesterol: 141 mg/dL (ref 0–200)
HDL: 53.5 mg/dL (ref >39.00)
LDL Cholesterol: 76 mg/dL (ref 0–99)
NonHDL: 87.19
Total CHOL/HDL Ratio: 3
Triglycerides: 56 mg/dL (ref 0.0–149.0)
VLDL: 11.2 mg/dL (ref 0.0–40.0)

## 2021-12-04 LAB — VITAMIN D 25 HYDROXY (VIT D DEFICIENCY, FRACTURES): VITD: 27.08 ng/mL — ABNORMAL LOW (ref 30.00–100.00)

## 2021-12-04 LAB — HEMOGLOBIN A1C: Hgb A1c MFr Bld: 5.4 % (ref 4.6–6.5)

## 2021-12-04 LAB — TSH: TSH: 1.98 u[IU]/mL (ref 0.35–5.50)

## 2021-12-04 NOTE — Addendum Note (Signed)
Addended by: Warden Fillers on: 12/04/2021 08:54 AM   Modules accepted: Orders

## 2021-12-05 LAB — URINALYSIS, ROUTINE W REFLEX MICROSCOPIC
Bilirubin Urine: NEGATIVE
Glucose, UA: NEGATIVE
Hgb urine dipstick: NEGATIVE
Ketones, ur: NEGATIVE
Leukocytes,Ua: NEGATIVE
Nitrite: NEGATIVE
Protein, ur: NEGATIVE
Specific Gravity, Urine: 1.008 (ref 1.001–1.035)
pH: 6 (ref 5.0–8.0)

## 2021-12-05 LAB — HEPATITIS C ANTIBODY
Hepatitis C Ab: NONREACTIVE
SIGNAL TO CUT-OFF: 0.04 (ref <1.00)

## 2021-12-05 LAB — HIV ANTIBODY (ROUTINE TESTING W REFLEX): HIV 1&2 Ab, 4th Generation: NONREACTIVE

## 2021-12-05 LAB — RPR: RPR Ser Ql: NONREACTIVE

## 2021-12-06 LAB — URINE CYTOLOGY ANCILLARY ONLY
Chlamydia: NEGATIVE
Comment: NEGATIVE
Comment: NEGATIVE
Comment: NORMAL
Neisseria Gonorrhea: NEGATIVE
Trichomonas: NEGATIVE

## 2021-12-25 ENCOUNTER — Encounter: Payer: Self-pay | Admitting: Internal Medicine

## 2021-12-29 ENCOUNTER — Other Ambulatory Visit (HOSPITAL_COMMUNITY): Payer: Self-pay

## 2022-01-02 ENCOUNTER — Other Ambulatory Visit: Payer: Self-pay

## 2022-01-02 ENCOUNTER — Encounter: Payer: Self-pay | Admitting: Internal Medicine

## 2022-01-02 ENCOUNTER — Ambulatory Visit (INDEPENDENT_AMBULATORY_CARE_PROVIDER_SITE_OTHER): Payer: BLUE CROSS/BLUE SHIELD | Admitting: Internal Medicine

## 2022-01-02 VITALS — BP 118/80 | HR 74 | Temp 98.3°F | Ht 74.0 in | Wt 220.0 lb

## 2022-01-02 DIAGNOSIS — G4700 Insomnia, unspecified: Secondary | ICD-10-CM | POA: Diagnosis not present

## 2022-01-02 DIAGNOSIS — F4323 Adjustment disorder with mixed anxiety and depressed mood: Secondary | ICD-10-CM

## 2022-01-02 DIAGNOSIS — M25431 Effusion, right wrist: Secondary | ICD-10-CM | POA: Diagnosis not present

## 2022-01-02 DIAGNOSIS — M25531 Pain in right wrist: Secondary | ICD-10-CM | POA: Diagnosis not present

## 2022-01-02 MED ORDER — BUPROPION HCL ER (XL) 150 MG PO TB24: 300.0000 mg | ORAL_TABLET | Freq: Every day | ORAL | 3 refills | Status: DC

## 2022-01-02 MED ORDER — METHYLPREDNISOLONE 4 MG PO TBPK: ORAL_TABLET | ORAL | 0 refills | Status: DC

## 2022-01-02 NOTE — Patient Instructions (Addendum)
Aspercream with lidocaine  Or  Voltaren gel   Stress Relax brand Tranquil Sleep  Meditation for sleep insight timer, calm, headspace, replika  Wrist Sprain, Adult A wrist sprain is a stretch or tear in the strong tissues that connect the wrist bones to each other. These strong tissues are called ligaments. There are three types of wrist sprains: Grade 1. The ligament is stretched more than normal. There may be a minor amount of wrist pain. Grade 2. The ligament is partially torn. You may be able to move your wrist, but not very much. There may be a moderate amount of wrist pain. Grade 3. The ligament or ligaments are completely torn. You may find it difficult to move your wrist even a little. There may be a significant amount of wrist pain. What are the causes? This condition may be caused by using the wrist too much during sports, exercise, or work. It can also happen due to a fall or during an accident. What increases the risk? You are more likely to develop this condition if: You had a previous wrist or arm injury. You have poor wrist strength and flexibility. You play contact sports, such as football or soccer. You participate in sports that may result in a fall, such as skateboarding, biking, skiing, or snowboarding. You do not exercise regularly. You use exercise equipment that does not fit well. What are the signs or symptoms? Symptoms of this condition include: Pain in the wrist, arm, or hand. Swelling or bruised skin near the wrist, hand, or arm. The skin may look yellow or blue. Stiffness or trouble moving the hand. Hearing a noise, like a pop or a snap, at the time of injury, or feeling a tear at the time of the injury. A warm feeling in the skin around the wrist. How is this diagnosed? This condition is diagnosed with a physical exam. Sometimes an X-ray is taken to make sure a bone did not break. You may also have an MRI of your wrist to check for torn ligaments. How is  this treated? This condition is treated by resting and applying ice to your wrist. Additional treatment may include: Taking medicine for pain and inflammation. Wearing a splint, brace, or cast for a short period of time to keep your wrist from moving (immobilized). Doing exercises to strengthen and stretch your wrist. Having surgery. This may be done if the ligament is completely torn. Follow these instructions at home: If you have a splint or brace: Wear the splint or brace as told by your health care provider. Remove it only as told by your health care provider. Loosen it if your fingers tingle, become numb, or turn cold and blue. Keep it clean. If the splint or brace is not waterproof: Do not let it get wet. Cover it with a watertight covering when you take a bath or a shower. If you have a cast: Do not put pressure on any part of the cast until it is fully hardened. This may take several hours. Do not stick anything inside the cast to scratch your skin. Doing that increases your risk of infection. Check the skin around the cast every day. Tell your health care provider about any concerns. You may put lotion on dry skin around the edges of the cast. Do not put lotion on the skin underneath the cast. Keep it clean. If the cast is not waterproof: Do not let it get wet. Cover it with a watertight covering when you take  a bath or shower. Managing pain, stiffness, and swelling  If directed, put ice on the injured area. To do this: If you have a removable splint or brace, remove it as told by your health care provider. Put ice in a plastic bag. Place a towel between your skin and the bag or between the splint or cast and the bag. Leave the ice on for 20 minutes, 2-3 times a day. Remove the ice if your skin turns bright red. This is very important. If you cannot feel pain, heat, or cold, you have a greater risk of damage to the area. Move your fingers often to reduce stiffness and  swelling. Raise (elevate) the injured area above the level of your heart while you are sitting or lying down. Activity Rest your wrist as told by your health care provider. Do not do things that cause pain. Ask your health care provider when it is safe to drive if you have a splint, brace, or cast on your wrist. Do exercises as told by your health care provider. Return to your normal activities as told by your health care provider. Ask your health care provider what activities are safe for you. General instructions Take over-the-counter and prescription medicines only as told by your health care provider. Do not use any products that contain nicotine or tobacco, such as cigarettes, e-cigarettes, and chewing tobacco. These can delay healing. If you need help quitting, ask your health care provider. Keep all follow-up visits. This is important. Contact a health care provider if: Your pain, bruising, or swelling gets worse. Your skin becomes red, gets a rash, or has open sores. Your pain does not get better or it gets worse. Get help right away if: You have a new or sudden sharp pain in the hand, arm, or wrist. You have tingling or numbness in your hand. Your fingers turn white, very red, or cold and blue. You cannot move your fingers. Summary A wrist sprain is damage to ligaments in your wrist. Wrist sprains can range from mild to severe. Return to your normal activities as told by your health care provider. Ask your health care provider what activities are safe for you. You may need to wear a splint, brace, or cast for a short period of time. This information is not intended to replace advice given to you by your health care provider. Make sure you discuss any questions you have with your health care provider. Document Revised: 02/29/2020 Document Reviewed: 02/29/2020 Elsevier Patient Education  2022 [employer]  Wrist and Forearm Exercises Ask your health care provider which  exercises are safe for you. Do exercises exactly as told by your health care provider and adjust them as directed. It is normal to feel mild stretching, pulling, tightness, or discomfort as you do these exercises. Stop right away if you feel sudden pain or your pain gets worse. Do not begin these exercises until told by your health care provider. Range-of-motion exercises These exercises warm up your muscles and joints and improve the movement and flexibility of your injured wrist and forearm. These exercises also help to relieve pain, numbness, and tingling. These exercises are done using the muscles in your injured wrist and forearm. Wrist flexion Bend your left / right elbow to a 90-degree angle (right angle) with your palm facing the floor. Bend your wrist so that your fingers point toward the floor (flexion). Hold this position for __________ seconds. Slowly return to the starting position. Repeat __________ times. Complete this  exercise __________ times a day. Wrist extension Bend your left / right elbow to a 90-degree angle (right angle) with your palm facing the floor. Bend your wrist so that your fingers point toward the ceiling (extension). Hold this position for __________ seconds. Slowly return to the starting position. Repeat __________ times. Complete this exercise __________ times a day. Ulnar deviation Bend your left / right elbow to a 90-degree angle (right angle), and rest your forearm on a table with your palm facing down. Keeping your hand flat on the table, bend your left /right wrist toward your small finger (pinkie). This is ulnar deviation. Hold this position for __________ seconds. Slowly return to the starting position. Repeat __________ times. Complete this exercise __________ times a day. Radial deviation Bend your left / right elbow to a 90-degree angle (right angle), and rest your forearm on a table with your palm facing down. Keeping your hand flat on the table,  bend your left /right wrist toward your thumb. This is radial deviation. Hold this position for __________ seconds. Slowly return to the starting position. Repeat __________ times. Complete this exercise __________ times a day. Forearm rotation, supination  Sit with your left / right elbow bent to a 90-degree angle (right angle). Position your forearm so that the thumb is facing the ceiling (neutral position). Turn (rotate) your palm up toward the ceiling (supination), stopping when you feel a gentle stretch. Hold this position for __________ seconds. Slowly return to the starting position. Repeat __________ times. Complete this exercise __________ times a day. Forearm rotation, pronation  Sit with your left / right elbow bent to a 90-degree angle (right angle). Position your forearm so that the thumb is facing the ceiling (neutral position). Rotate your palm down toward the floor (pronation), stopping when you feel a gentle stretch. Hold this position for __________ seconds. Slowly return to the starting position. Repeat __________ times. Complete this exercise __________ times a day. Stretching These exercises warm up your muscles and joints and improve the movement and flexibility of your injured wrist and forearm. These exercises also help to relieve pain, numbness, and tingling. These exercises are done using your healthy wrist and forearm to help stretch the muscles in your injured wrist and forearm. Wrist flexion  Extend your left / right arm in front of you, and turn your palm down toward the floor. If told by your health care provider, bend your left / right elbow to a 90-degree angle (right angle) at your side. Using your uninjured hand, gently press over the back of your left / right hand to bend your wrist and fingers toward the floor (flexion). Go as far as you can to feel a stretch without causing pain. Hold this position for __________ seconds. Slowly return to the starting  position. Repeat __________ times. Complete this exercise __________ times a day. Wrist extension  Extend your left / right arm in front of you and turn your palm up toward the ceiling. If told by your health care provider, bend your left / right elbow to a 90-degree angle (right angle) at your side. Using your uninjured hand, gently press over the palm of your left / right hand to bend your wrist and fingers toward the floor (extension). Go as far as you can to feel a stretch without causing pain. Hold this position for __________ seconds. Slowly return to the starting position. Repeat __________ times. Complete this exercise __________ times a day. Forearm rotation, supination Sit with your left /  right elbow bent to a 90-degree angle (right angle). Position your forearm so that the thumb is facing the ceiling (neutral position). Rotate your palm up toward the ceiling as far as you can on your own (supination). Then, use your uninjured hand to help turn your forearm more, stopping when you feel a gentle stretch. Hold this position for __________ seconds. Slowly return to the starting position. Repeat __________ times. Complete this exercise __________ times a day. Forearm rotation, pronation Sit with your left / right elbow bent to a 90-degree angle (right angle). Position your forearm so that the thumb is facing the ceiling (neutral position). Rotate your palm down toward the floor as far as you can on your own (pronation). Then, use your uninjured hand to help turn your forearm more, stopping when you feel a gentle stretch. Hold this position for __________ seconds. Slowly return to the starting position. Repeat __________ times. Complete this exercise __________ times a day. Strengthening exercises These exercises build strength and endurance in your wrist and forearm. Endurance is the ability to use your muscles for a long time, even after they get tired. Wrist flexion  Sit with your  left / right forearm supported on a table or other surface. Bend your elbow to a 90-degree angle (right angle), and rest your hand palm-up over the edge of the table. Hold a __________ weight in your left / right hand. Or, hold an exercise band or tube in both hands, keeping your hands at the same level and hip distance apart. There should be a slight tension in the exercise band or tube. Slowly curl your hand up toward the ceiling (flexion). Hold this position for __________ seconds. Slowly lower your hand back to the starting position. Repeat __________ times. Complete this exercise __________ times a day. Wrist extension  Sit with your left / right forearm supported on a table or other surface. Bend your elbow to a 90-degree angle (right angle), and rest your hand palm-down over the edge of the table. Hold a __________ weight in your left / right hand. Or, hold an exercise band or tube in both hands, keeping your hands at the same level and hip distance apart. There should be a slight tension in the exercise band or tube. Slowly curl your hand up toward the ceiling (extension). Hold this position for __________ seconds. Slowly lower your hand back to the starting position. Repeat __________ times. Complete this exercise __________ times a day. Forearm rotation, supination  Sit with your left / right forearm supported on a table or other surface. Bend your elbow to a 90-degree angle (right angle). Position your forearm so that your thumb is facing the ceiling (neutral position) and your hand is resting over the edge of the table. Hold a hammer in your left / right hand. This exercise will be easier if you hold the hammer near the head of the hammer. This exercise will be harder if you hold the hammer near the end of the handle. Without moving your elbow, slowly rotate your palm up toward the ceiling (supination). Hold this position for __________ seconds. Slowly return to the starting  position. Repeat __________ times. Complete this exercise __________ times a day. Forearm rotation, pronation  Sit with your left / right forearm supported on a table or other surface. Bend your elbow to a 90-degree angle (right angle). Position your forearm so that the thumb is facing the ceiling (neutral position), with your hand resting over the edge of the  table. Hold a hammer in your left / right hand. This exercise will be easier if you hold the hammer near the head of the hammer. This exercise will be harder if you hold the hammer near the end of the handle. Without moving your elbow, slowly rotate your palm down toward the floor (pronation). Hold this position for __________ seconds. Slowly return to the starting position. Repeat __________ times. Complete this exercise __________ times a day. Grip strengthening  Grasp a stress ball or other ball in the middle of your left / right hand. Start with your elbow bent to a 90-degree angle (right angle). Slowly increase the pressure, squeezing the ball as hard as you can without causing pain. Think of bringing the tips of your fingers into the middle of your palm. All of your finger joints should bend when doing this exercise. To make this exercise harder, gradually try to straighten your elbow in front of you, until you can do the exercise with your elbow fully straight. Hold your squeeze for __________ seconds, then relax. If instructed by your health care provider, do this exercise: With your forearm positioned so that the thumb is facing the ceiling (neutral position). With your forearm turned palm down. With your forearm turned palm up. Repeat __________ times. Complete this exercise __________ times a day. This information is not intended to replace advice given to you by your health care provider. Make sure you discuss any questions you have with your health care provider. Document Revised: 12/11/2018 Document Reviewed:  12/11/2018 [employer] Patient Education  2022 [employer]  Wrist Pain, Adult There are many things that can cause wrist pain. Some common causes include: An injury to the wrist area, such as a sprain, strain, or fracture. Overuse of the joint. A condition that causes increased pressure on a nerve in the wrist (carpal tunnel syndrome). Wear and tear of the joints that occurs with aging (osteoarthritis). Other types of joint inflammation and stiffness (arthritis). Sometimes, the cause of wrist pain is not known. Often, the pain goes away when you follow instructions from your health care provider for relieving pain at home, such as resting the wrist, icing the wrist, or using a splint or an elastic wrap for a short time. If your wrist pain continues, it is important to tell your health care provider. Follow these instructions at home: If you have a splint or elastic wrap: Wear the splint or wrap as told by your health care provider. Remove it only as told by your health care provider. Ask your health care provider if you may remove it for bathing. Loosen the splint or wrap if your fingers tingle, become numb, or turn cold and blue. Check the skin around the splint or wrap every day. Tell your health care provider about any concerns. Keep the splint or wrap clean. If the splint or wrap is not waterproof: Do not let it get wet. Cover it with a watertight covering when you take a bath or shower. Managing pain, stiffness, and swelling  If directed, put ice on the painful area. To do this: If you have a removable splint or wrap, remove it as told by your health care provider. Put ice in a plastic bag. Place a towel between your skin and the bag or between your splint or wrap and the bag. Leave the ice on for 20 minutes, 2-3 times a day. Move your fingers often to reduce stiffness and swelling. Raise (elevate) the injured area above the level  of your heart while you are sitting or lying  down. Activity Rest your affected wrist as told by your health care provider. Return to your normal activities as told by your health care provider. Ask your health care provider what activities are safe for you. Ask your health care provider when it is safe to drive if you have a splint or wrap on your wrist. Do exercises as told by your health care provider. General instructions Pay attention to any changes in your symptoms. Take over-the-counter and prescription medicines only as told by your health care provider. Keep all follow-up visits as told by your health care provider. This is important. Contact a health care provider if: You have a sudden, sharp pain in the wrist, hand, or arm that is different or new. The swelling or bruising on your wrist or hand gets worse. Your skin becomes red, gets a rash, or has open sores. Your pain does not get better or it gets worse. You have a fever or chills. Get help right away if: You lose feeling in your fingers or hand. Your fingers turn white, very red, or cold and blue. You cannot move your fingers. Summary Wrist pain in an adult has many different causes. If your wrist pain continues, it is important to tell your health care provider. You may need to wear a splint or an elastic wrap for a short period of time. Return to your normal activities as told by your health care provider. Ask your health care provider what activities are safe for you. This information is not intended to replace advice given to you by your health care provider. Make sure you discuss any questions you have with your health care provider. Document Revised: 09/10/2019 Document Reviewed: 09/10/2019 Elsevier Patient Education  2022 [employer].

## 2022-01-02 NOTE — Progress Notes (Signed)
Chief Complaint  Patient presents with   Wrist Injury   F/u  1. Right wrist pain 6/10 new x 2 weeks working in garden 2. Depression and insomnia sleeping 5 hrs seeing therapist and still not helping some better on wellbutrin xl 150 mg qd wants to try to increase to 300 mg qd  Tried melatonin otc and not helping  Reviewed wellbutrin xl has side effect insomnia 11-40%    Review of Systems  Constitutional:  Negative for weight loss.  HENT:  Negative for hearing loss.   Eyes:  Negative for blurred vision.  Respiratory:  Negative for shortness of breath.   Cardiovascular:  Negative for chest pain.  Gastrointestinal:  Negative for abdominal pain and blood in stool.  Musculoskeletal:  Negative for back pain.  Skin:  Negative for rash.  Neurological:  Negative for headaches.  Psychiatric/Behavioral:  Positive for depression. The patient is nervous/anxious and has insomnia.   Past Medical History:  Diagnosis Date   Allergy    Asthma    childhood   Chicken pox    HNP (herniated nucleus pulposus), lumbar    URI (upper respiratory infection)    09/2021 given Augmentin went to [facility] on [address] better as of 11/03/21   Past Surgical History:  Procedure Laterality Date   BACK SURGERY     repair of ruptured disc L4/L5    LUMBAR LAMINECTOMY/DECOMPRESSION MICRODISCECTOMY Right 10/13/2015   Procedure: MICRO LUMBAR DECOMPRESSION L5 - S1 ON THE RIGHT, with specimen of l5-s1;  Surgeon: Jene Every, MD;  Location: [facility];  Service: Orthopedics;  Laterality: Right;   Family History  Problem Relation Age of Onset   Cancer Father        liver   Diabetes Father    Heart disease Father        chf   Early death Father        age 60    Hyperlipidemia Father    Hypertension Father    Stroke Father    Alcohol abuse Sister    Depression Sister    Drug abuse Sister    Arthritis Maternal Grandmother    Cancer Maternal Grandmother    Hyperlipidemia Maternal Grandmother    Hypertension Maternal  Grandmother    Glaucoma Maternal Grandmother    Arthritis Maternal Grandfather    Heart disease Maternal Grandfather    Hyperlipidemia Maternal Grandfather    Hypertension Maternal Grandfather    Arthritis Paternal Grandfather    Depression Paternal Grandfather    Hyperlipidemia Paternal Grandfather    Heart disease Paternal Grandfather    Hypertension Paternal Grandfather    Stroke Paternal Grandfather    Social History   Socioeconomic History   Marital status: Legally Separated    Spouse name: Not on file   Number of children: Not on file   Years of education: Not on file   Highest education level: Not on file  Occupational History   Not on file  Tobacco Use   Smoking status: Never   Smokeless tobacco: Never  Substance and Sexual Activity   Alcohol use: Yes    Comment: occasional wine   Drug use: No   Sexual activity: Yes  Other Topics Concern   Not on file  Social History Narrative   divorced, wife 04/2020   2 kids  32 y.o daughter 17 y.o son    Psychologist, 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   cetirizine (ZYRTEC) 10 MG tablet Take 1 tablet (10 mg total) by mouth daily as needed for allergies.   emtricitabine-tenofovir (TRUVADA) 200-300 MG tablet TAKE 1 TABLET BY MOUTH DAILY AS DIRECTED.   methylPREDNISolone (MEDROL DOSEPAK) 4 MG TBPK tablet As directed in the am   [DISCONTINUED] buPROPion (WELLBUTRIN XL) 150 MG 24 hr tablet  Take 1 tablet (150 mg total) by mouth daily.   No Known Allergies Recent Results (from the past 2160 hour(s))  Hepatitis C antibody     Status: None   Collection Time: 12/04/21  8:37 AM  Result Value Ref Range   Hepatitis C Ab NON-REACTIVE NON-REACTIVE   SIGNAL TO CUT-OFF 0.04 <1.00    Comment: . HCV antibody was non-reactive. There is no laboratory  evidence of HCV infection. . In most cases, no further action is required. However, if recent HCV exposure is suspected, a test for HCV RNA (test code 96759) is suggested. . For additional information please refer to http://education.[facility].com/faq/FAQ22v1 (This link is being provided for informational/ educational purposes only.) .   HIV antibody (with reflex)     Status: None   Collection Time: 12/04/21  8:37 AM  Result Value Ref Range   HIV 1&2 Ab, 4th Generation NON-REACTIVE NON-REACTIVE    Comment: HIV-1 antigen and HIV-1/HIV-2 antibodies were not detected. There is no laboratory evidence of HIV infection. Marland Kitchen PLEASE NOTE: This information has been disclosed to you from records whose confidentiality may be protected by state law.  If your state requires such protection, then the state law prohibits you from making any further disclosure of the information without the specific written consent of the person to whom it pertains, or as otherwise permitted by law. A general authorization for the release of medical or other information is NOT sufficient for this purpose. . For additional information please refer to http://education.[facility].com/faq/FAQ106 (This link is being provided for informational/ educational purposes only.) . Marland Kitchen The performance of this assay has not been clinically validated in patients less than 79 years old. .   RPR     Status: None   Collection Time: 12/04/21  8:37 AM  Result Value Ref Range   RPR Ser Ql NON-REACTIVE NON-REACTIVE  Vitamin D (25 hydroxy)     Status: Abnormal    Collection Time: 12/04/21  8:37 AM  Result Value Ref Range   VITD 27.08 (L) 30.00 - 100.00 ng/mL  Hemoglobin A1c     Status: None   Collection Time: 12/04/21  8:37 AM  Result Value Ref Range   Hgb A1c MFr Bld 5.4 4.6 - 6.5 %    Comment: Glycemic Control Guidelines for People with Diabetes:Non Diabetic:  <6%Goal of Therapy: <7%Additional Action Suggested:  >8%   Urinalysis, Routine w reflex microscopic     Status: None   Collection Time: 12/04/21  8:37 AM  Result Value Ref Range   Color, Urine YELLOW YELLOW   APPearance CLEAR CLEAR   Specific  Gravity, Urine 1.008 1.001 - 1.035   pH 6.0 5.0 - 8.0   Glucose, UA NEGATIVE NEGATIVE   Bilirubin Urine NEGATIVE NEGATIVE   Ketones, ur NEGATIVE NEGATIVE   Hgb urine dipstick NEGATIVE NEGATIVE   Protein, ur NEGATIVE NEGATIVE   Nitrite NEGATIVE NEGATIVE   Leukocytes,Ua NEGATIVE NEGATIVE  TSH     Status: None   Collection Time: 12/04/21  8:37 AM  Result Value Ref Range   TSH 1.98 0.35 - 5.50 uIU/mL  CBC with Differential/Platelet     Status: None   Collection Time: 12/04/21  8:37 AM  Result Value Ref Range   WBC 8.1 4.0 - 10.5 K/uL   RBC 4.68 4.22 - 5.81 Mil/uL   Hemoglobin 14.1 13.0 - 17.0 g/dL   HCT 31.5 17.6 - 16.0 %   MCV 92.1 78.0 - 100.0 fl   MCHC 32.9 30.0 - 36.0 g/dL   RDW 73.7 10.6 - 26.9 %   Platelets 205.0 150.0 - 400.0 K/uL   Neutrophils Relative % 71.5 43.0 - 77.0 %   Lymphocytes Relative 19.0 12.0 - 46.0 %   Monocytes Relative 6.1 3.0 - 12.0 %   Eosinophils Relative 2.9 0.0 - 5.0 %   Basophils Relative 0.5 0.0 - 3.0 %   Neutro Abs 5.8 1.4 - 7.7 K/uL   Lymphs Abs 1.5 0.7 - 4.0 K/uL   Monocytes Absolute 0.5 0.1 - 1.0 K/uL   Eosinophils Absolute 0.2 0.0 - 0.7 K/uL   Basophils Absolute 0.0 0.0 - 0.1 K/uL  Lipid panel     Status: None   Collection Time: 12/04/21  8:37 AM  Result Value Ref Range   Cholesterol 141 0 - 200 mg/dL    Comment: ATP III Classification       Desirable:  < 200 mg/dL               Borderline High:   200 - 239 mg/dL          High:  > = 485 mg/dL   Triglycerides 46.2 0.0 - 149.0 mg/dL    Comment: Normal:  <703 mg/dLBorderline High:  150 - 199 mg/dL   HDL 50.09 >38.18 mg/dL   VLDL 29.9 0.0 - 37.1 mg/dL   LDL Cholesterol 76 0 - 99 mg/dL   Total CHOL/HDL Ratio 3     Comment:                Men          Women1/2 Average Risk     3.4          3.3Average Risk          5.0          4.42X Average Risk          9.6          7.13X Average Risk          15.0          11.0                       NonHDL 87.19     Comment: NOTE:  Non-HDL goal should be 30 mg/dL higher than patient's LDL goal (i.e. LDL goal of < 70 mg/dL, would have non-HDL goal of < 100 mg/dL)  Comprehensive metabolic panel     Status: None   Collection Time: 12/04/21  8:37 AM  Result Value Ref Range   Sodium 138 135 - 145 mEq/L  Potassium 4.7 3.5 - 5.1 mEq/L   Chloride 102 96 - 112 mEq/L   CO2 28 19 - 32 mEq/L   Glucose, Bld 96 70 - 99 mg/dL   BUN 17 6 - 23 mg/dL   Creatinine, Ser 4.091.09 0.40 - 1.50 mg/dL   Total Bilirubin 0.5 0.2 - 1.2 mg/dL   Alkaline Phosphatase 55 39 - 117 U/L   AST 14 0 - 37 U/L   ALT 17 0 - 53 U/L   Total Protein 6.8 6.0 - 8.3 g/dL   Albumin 4.4 3.5 - 5.2 g/dL   GFR 81.1984.75 >14.78>60.00 mL/min    Comment: Calculated using the CKD-EPI Creatinine Equation (2021)   Calcium 9.2 8.4 - 10.5 mg/dL  Urine cytology ancillary only(Seth Ward)     Status: None   Collection Time: 12/04/21  8:52 AM  Result Value Ref Range   Neisseria Gonorrhea Negative    Chlamydia Negative    Trichomonas Negative    Comment Normal Reference Ranger Chlamydia - Negative    Comment      Normal Reference Range Neisseria Gonorrhea - Negative   Comment Normal Reference Range Trichomonas - Negative    Objective  Body mass index is 28.25 kg/m. Wt Readings from Last 3 Encounters:  01/02/22 220 lb (99.8 kg)  11/03/21 215 lb 9.6 oz (97.8 kg)  01/26/21 240 lb 6 oz (109 kg)   Temp Readings from Last 3 Encounters:  01/02/22 98.3 F (36.8  C) (Oral)  11/03/21 97.6 F (36.4 C)  10/05/20 98.1 F (36.7 C) (Oral)   BP Readings from Last 3 Encounters:  01/02/22 118/80  11/03/21 130/70  01/26/21 138/82   Pulse Readings from Last 3 Encounters:  01/02/22 74  11/03/21 75  01/26/21 76    Physical Exam Vitals and nursing note reviewed.  Constitutional:      Appearance: Normal appearance. He is well-developed and well-groomed.  HENT:     Head: Normocephalic and atraumatic.  Eyes:     Conjunctiva/sclera: Conjunctivae normal.     Pupils: Pupils are equal, round, and reactive to light.  Cardiovascular:     Rate and Rhythm: Normal rate and regular rhythm.     Heart sounds: Normal heart sounds.  Pulmonary:     Effort: Pulmonary effort is normal. No respiratory distress.     Breath sounds: Normal breath sounds.  Abdominal:     Tenderness: There is no abdominal tenderness.  Musculoskeletal:     Right wrist: Swelling and tenderness present.       Arms:  Skin:    General: Skin is warm and moist.  Neurological:     General: No focal deficit present.     Mental Status: He is alert and oriented to person, place, and time. Mental status is at baseline.     Sensory: Sensation is intact.     Motor: Motor function is intact.     Coordination: Coordination is intact.     Gait: Gait is intact. Gait normal.  Psychiatric:        Attention and Perception: Attention and perception normal.        Mood and Affect: Mood and affect normal.        Speech: Speech normal.        Behavior: Behavior normal. Behavior is cooperative.        Thought Content: Thought content normal.        Cognition and Memory: Cognition and memory normal.        Judgment:  Judgment normal.    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 French Anaracy McLean-Scocuz[facility]

## 2022-01-03 ENCOUNTER — Other Ambulatory Visit (HOSPITAL_COMMUNITY): Payer: Self-pay

## 2022-01-29 ENCOUNTER — Other Ambulatory Visit (HOSPITAL_COMMUNITY): Payer: Self-pay

## 2022-01-31 ENCOUNTER — Other Ambulatory Visit (HOSPITAL_COMMUNITY): Payer: Self-pay

## 2022-02-02 ENCOUNTER — Ambulatory Visit: Payer: BLUE CROSS/BLUE SHIELD | Admitting: Internal Medicine

## 2022-02-26 ENCOUNTER — Other Ambulatory Visit (HOSPITAL_COMMUNITY): Payer: Self-pay

## 2022-02-28 ENCOUNTER — Other Ambulatory Visit (HOSPITAL_COMMUNITY): Payer: Self-pay

## 2022-03-07 ENCOUNTER — Other Ambulatory Visit (HOSPITAL_COMMUNITY): Payer: Self-pay

## 2022-03-30 ENCOUNTER — Other Ambulatory Visit (HOSPITAL_COMMUNITY): Payer: Self-pay

## 2022-04-03 ENCOUNTER — Other Ambulatory Visit (HOSPITAL_COMMUNITY): Payer: Self-pay

## 2022-04-05 ENCOUNTER — Other Ambulatory Visit (HOSPITAL_COMMUNITY): Payer: Self-pay

## 2022-04-10 ENCOUNTER — Other Ambulatory Visit (HOSPITAL_COMMUNITY): Payer: Self-pay

## 2022-04-26 ENCOUNTER — Other Ambulatory Visit (HOSPITAL_COMMUNITY): Payer: Self-pay

## 2022-04-27 ENCOUNTER — Other Ambulatory Visit (HOSPITAL_COMMUNITY): Payer: Self-pay

## 2022-05-03 ENCOUNTER — Other Ambulatory Visit (HOSPITAL_COMMUNITY): Payer: Self-pay

## 2022-05-09 ENCOUNTER — Other Ambulatory Visit (HOSPITAL_COMMUNITY): Payer: Self-pay

## 2022-05-30 ENCOUNTER — Other Ambulatory Visit (HOSPITAL_COMMUNITY): Payer: Self-pay

## 2022-05-31 ENCOUNTER — Other Ambulatory Visit (HOSPITAL_COMMUNITY): Payer: Self-pay

## 2022-06-13 ENCOUNTER — Other Ambulatory Visit (HOSPITAL_COMMUNITY): Payer: Self-pay

## 2022-07-05 ENCOUNTER — Other Ambulatory Visit (HOSPITAL_COMMUNITY): Payer: Self-pay

## 2022-07-10 ENCOUNTER — Other Ambulatory Visit (HOSPITAL_COMMUNITY): Payer: Self-pay

## 2022-07-12 ENCOUNTER — Other Ambulatory Visit (HOSPITAL_COMMUNITY): Payer: Self-pay

## 2022-07-16 ENCOUNTER — Other Ambulatory Visit (HOSPITAL_COMMUNITY): Payer: Self-pay

## 2022-07-18 ENCOUNTER — Other Ambulatory Visit (HOSPITAL_COMMUNITY): Payer: Self-pay

## 2022-07-28 ENCOUNTER — Other Ambulatory Visit: Payer: Self-pay | Admitting: Internal Medicine

## 2022-07-28 DIAGNOSIS — F4323 Adjustment disorder with mixed anxiety and depressed mood: Secondary | ICD-10-CM

## 2022-08-09 ENCOUNTER — Other Ambulatory Visit (HOSPITAL_COMMUNITY): Payer: Self-pay

## 2022-08-13 ENCOUNTER — Other Ambulatory Visit (HOSPITAL_COMMUNITY): Payer: Self-pay

## 2022-08-14 ENCOUNTER — Other Ambulatory Visit (HOSPITAL_COMMUNITY): Payer: Self-pay

## 2022-08-21 ENCOUNTER — Other Ambulatory Visit (HOSPITAL_COMMUNITY): Payer: Self-pay

## 2022-09-11 ENCOUNTER — Other Ambulatory Visit (HOSPITAL_COMMUNITY): Payer: Self-pay

## 2022-09-12 ENCOUNTER — Other Ambulatory Visit (HOSPITAL_COMMUNITY): Payer: Self-pay

## 2022-09-20 ENCOUNTER — Other Ambulatory Visit (HOSPITAL_COMMUNITY): Payer: Self-pay

## 2022-10-04 ENCOUNTER — Other Ambulatory Visit (HOSPITAL_COMMUNITY): Payer: Self-pay

## 2022-10-09 ENCOUNTER — Other Ambulatory Visit (HOSPITAL_COMMUNITY): Payer: Self-pay

## 2022-10-11 ENCOUNTER — Other Ambulatory Visit (HOSPITAL_COMMUNITY): Payer: Self-pay

## 2022-10-15 ENCOUNTER — Other Ambulatory Visit (HOSPITAL_COMMUNITY): Payer: Self-pay

## 2022-10-18 DIAGNOSIS — N342 Other urethritis: Secondary | ICD-10-CM | POA: Diagnosis not present

## 2022-10-26 ENCOUNTER — Other Ambulatory Visit (HOSPITAL_COMMUNITY): Payer: Self-pay

## 2022-10-26 ENCOUNTER — Other Ambulatory Visit: Payer: Self-pay | Admitting: Internal Medicine

## 2022-10-26 ENCOUNTER — Other Ambulatory Visit: Payer: Self-pay

## 2022-10-26 ENCOUNTER — Telehealth: Payer: Self-pay

## 2022-10-26 DIAGNOSIS — Z7253 High risk bisexual behavior: Secondary | ICD-10-CM

## 2022-10-26 DIAGNOSIS — Z79899 Other long term (current) drug therapy: Secondary | ICD-10-CM

## 2022-10-26 NOTE — Telephone Encounter (Signed)
Pt called wanting an update on his medication being filled today. I let the pt know that we did send the request to [facility] assistant and she will send it to another doctor to get filled. Pt stated that he was upset that he did not receive any notifications that mclean was gone. I let him know that [facility] sent out a mychart message and the office also sent out letters in the mail. Pt stated he did not get any of those notifications. Pt stated that he has to go without his medication and this can affect his health. Pt wanted the office manager name and I gave it to him.

## 2022-10-26 NOTE — Telephone Encounter (Signed)
Prescription Request  10/26/2022  Is this a "Controlled Substance" medicine? No  LOV: Visit date not found  What is the name of the medication or equipment? Emtricitabine-tenofovir (TDF)  Have you contacted your pharmacy to request a refill? Yes   Which pharmacy would you like this sent to? [facility]   Patient notified that their request is being sent to the clinical staff for review and that they should receive a response within 2 business days.   Please advise at Mobile [phone] (mobile)   Patient states his pharmacy just sent a request to [place].  Patient states he just took the last dose of this medication and needs to have it refilled.

## 2022-10-27 MED ORDER — EMTRICITABINE-TENOFOVIR DF 200-300 MG PO TABS
1.0000 | ORAL_TABLET | Freq: Every day | ORAL | 3 refills | Status: DC
  Filled 2022-10-27: qty 30, 30d supply, fill #0

## 2022-10-30 ENCOUNTER — Other Ambulatory Visit: Payer: Self-pay

## 2022-10-30 ENCOUNTER — Other Ambulatory Visit (HOSPITAL_COMMUNITY): Payer: Self-pay

## 2022-11-01 ENCOUNTER — Encounter: Payer: BLUE CROSS/BLUE SHIELD | Admitting: Nurse Practitioner

## 2022-11-06 ENCOUNTER — Encounter: Payer: Self-pay | Admitting: Internal Medicine

## 2022-11-06 ENCOUNTER — Ambulatory Visit: Payer: BC Managed Care – PPO | Admitting: Internal Medicine

## 2022-11-06 VITALS — BP 136/88 | HR 77 | Temp 98.7°F | Ht 74.0 in | Wt 225.0 lb

## 2022-11-06 DIAGNOSIS — Z0000 Encounter for general adult medical examination without abnormal findings: Secondary | ICD-10-CM

## 2022-11-06 DIAGNOSIS — F325 Major depressive disorder, single episode, in full remission: Secondary | ICD-10-CM | POA: Diagnosis not present

## 2022-11-06 DIAGNOSIS — Z7253 High risk bisexual behavior: Secondary | ICD-10-CM | POA: Diagnosis not present

## 2022-11-06 DIAGNOSIS — Z79899 Other long term (current) drug therapy: Secondary | ICD-10-CM

## 2022-11-06 LAB — HEPATIC FUNCTION PANEL
ALT: 32 U/L (ref 0–53)
AST: 21 U/L (ref 0–37)
Albumin: 4.7 g/dL (ref 3.5–5.2)
Alkaline Phosphatase: 53 U/L (ref 39–117)
Bilirubin, Direct: 0.1 mg/dL (ref 0.0–0.3)
Total Bilirubin: 0.6 mg/dL (ref 0.2–1.2)
Total Protein: 7.6 g/dL (ref 6.0–8.3)

## 2022-11-06 LAB — BASIC METABOLIC PANEL
BUN: 14 mg/dL (ref 6–23)
CO2: 26 mEq/L (ref 19–32)
Calcium: 9.3 mg/dL (ref 8.4–10.5)
Chloride: 103 mEq/L (ref 96–112)
Creatinine, Ser: 1.07 mg/dL (ref 0.40–1.50)
GFR: 86.1 mL/min (ref >60.00)
Glucose, Bld: 93 mg/dL (ref 70–99)
Potassium: 4 mEq/L (ref 3.5–5.1)
Sodium: 138 mEq/L (ref 135–145)

## 2022-11-06 NOTE — Patient Instructions (Signed)
Safe Sex Practicing safe sex means taking steps before and during sex to reduce your risk of: Getting an STI (sexually transmitted infection). Giving your partner an STI. Unwanted or unplanned pregnancy. How to practice safe sex Ways you can practice safe sex  Limit your sexual partners to only one partner who is having sex with only you. Avoid using alcohol and drugs before having sex. Alcohol and drugs can affect your judgment. Before having sex with a new partner: Talk to your partner about past partners, past STIs, and drug use. Get screened for STIs and discuss the results with your partner. Ask your partner to get screened too. Check your body regularly for sores, blisters, rashes, or unusual discharge. If you notice any of these problems, visit your health care provider. Avoid sexual contact if you have symptoms of an infection or you are being treated for an STI. While having sex, use a condom. Make sure to: Use a condom every time you have vaginal, oral, or anal sex. Both females and males should wear condoms during oral sex. Keep condoms in place from the beginning to the end of sexual activity. Use a latex condom, if possible. Latex condoms offer the best protection. Use only water-based lubricants with a condom. Using petroleum-based lubricants or oils will weaken the condom and increase the chance that it will break. Ways your health care provider can help you practice safe sex  See your health care provider for regular screenings, exams, and tests for STIs. Talk with your health care provider about what kind of birth control (contraception) is best for you. Get vaccinated against hepatitis B and human papillomavirus (HPV). If you are at risk of being infected with HIV (human immunodeficiency virus), talk with your health care provider about taking a prescription medicine to prevent HIV infection. You are at risk for HIV if you: Are a man who has sex with other men. Are  sexually active with more than one partner. Take drugs by injection. Have a sex partner who has HIV. Have unprotected sex. Have sex with someone who has sex with both men and women. Have had an STI. Follow these instructions at home: Take over-the-counter and prescription medicines only as told by your health care provider. Keep all follow-up visits. This is important. Where to find more information Centers for Disease Control and Prevention: www.cdc.gov Planned Parenthood: www.plannedparenthood.org Office on Women's Health: www.womenshealth.gov Summary Practicing safe sex means taking steps before and during sex to reduce your risk getting an STI, giving your partner an STI, and having an unwanted or unplanned pregnancy. Before having sex with a new partner, talk to your partner about past partners, past STIs, and drug use. Use a condom every time you have vaginal, oral, or anal sex. Both females and males should wear condoms during oral sex. Check your body regularly for sores, blisters, rashes, or unusual discharge. If you notice any of these problems, visit your health care provider. See your health care provider for regular screenings, exams, and tests for STIs. This information is not intended to replace advice given to you by your health care provider. Make sure you discuss any questions you have with your health care provider. Document Revised: 03/28/2020 Document Reviewed: 03/28/2020 Elsevier Patient Education  2023 [employer]  

## 2022-11-06 NOTE — Progress Notes (Signed)
Subjective:  Patient ID: Blake Luna, male    DOB: 26-Aug-1981  Age: 42 y.o. MRN: [id number]  CC: Annual Exam   HPI Blake Luna presents for a CPX and to establish.  He would like to continue taking PrEP.  He has felt well recently and would like to taper off of Wellbutrin.  History Blake Luna has a past medical history of Allergy, Asthma, Chicken pox, HNP (herniated nucleus pulposus), lumbar, and URI (upper respiratory infection).   He has a past surgical history that includes Lumbar laminectomy/decompression microdiscectomy (Right, 10/13/2015) and Back surgery.   His family history includes Alcohol abuse in his sister; Arthritis in his maternal grandfather, maternal grandmother, and paternal grandfather; Cancer in his father and maternal grandmother; Depression in his paternal grandfather and sister; Diabetes in his father; Drug abuse in his sister; Early death in his father; Glaucoma in his maternal grandmother; Heart disease in his father, maternal grandfather, and paternal grandfather; Hyperlipidemia in his father, maternal grandfather, maternal grandmother, and paternal grandfather; Hypertension in his father, maternal grandfather, maternal grandmother, and paternal grandfather; Stroke in his father and paternal grandfather.He reports that he has never smoked. He has never been exposed to tobacco smoke. He has never used smokeless tobacco. He reports current alcohol use of about 5.0 standard drinks of alcohol per week. He reports that he does not use drugs.  Outpatient Medications Prior to Visit  Medication Sig Dispense Refill   albuterol (VENTOLIN HFA) 108 (90 Base) MCG/ACT inhaler Inhale 1-2 puffs into the lungs every 6 (six) hours as needed for wheezing or shortness of breath. 18 g 11   buPROPion (WELLBUTRIN XL) 150 MG 24 hr tablet TAKE 2 TABLETS BY MOUTH DAILY. 180 tablet 3   azelastine (ASTELIN) 0.1 % nasal spray Place 2 sprays into both nostrils daily. Use in  each nostril as directed 30 mL 11   cetirizine (ZYRTEC) 10 MG tablet Take 1 tablet (10 mg total) by mouth daily as needed for allergies. 90 tablet 3   emtricitabine-tenofovir (TRUVADA) 200-300 MG tablet TAKE 1 TABLET BY MOUTH DAILY AS DIRECTED. 90 tablet 3   fluticasone (FLONASE) 50 MCG/ACT nasal spray Place 2 sprays into both nostrils daily. 16 g 11   methylPREDNISolone (MEDROL DOSEPAK) 4 MG TBPK tablet As directed in the am 21 tablet 0   No facility-administered medications prior to visit.    ROS Review of Systems  Constitutional:  Negative for chills, diaphoresis, fatigue and fever.  HENT: Negative.  Negative for sore throat and trouble swallowing.   Eyes: Negative.   Respiratory:  Negative for cough, chest tightness, shortness of breath and wheezing.   Cardiovascular:  Negative for chest pain, palpitations and leg swelling.  Gastrointestinal:  Negative for abdominal pain, constipation and diarrhea.  Endocrine: Negative.   Genitourinary: Negative.  Negative for difficulty urinating, dysuria, penile pain, penile swelling, scrotal swelling and testicular pain.  Musculoskeletal: Negative.  Negative for arthralgias.  Skin: Negative.  Negative for color change and rash.  Neurological:  Negative for dizziness and weakness.  Hematological:  Negative for adenopathy. Does not bruise/bleed easily.  Psychiatric/Behavioral: Negative.  Negative for decreased concentration and dysphoric mood. The patient is not nervous/anxious.     Objective:  BP 136/88 (BP Location: Left Arm, Patient Position: Sitting, Cuff Size: Large)   Pulse 77   Temp 98.7 F (37.1 C) (Oral)   Ht 6\' 2"  (1.88 m)   Wt 225 lb (102.1 kg)   SpO2 97%   BMI 28.89 kg/m   Physical  Exam Vitals reviewed.  Constitutional:      Appearance: He is not ill-appearing.  HENT:     Nose: Nose normal.     Mouth/Throat:     Mouth: Mucous membranes are moist.  Eyes:     General: No scleral icterus.    Conjunctiva/sclera:  Conjunctivae normal.  Cardiovascular:     Rate and Rhythm: Normal rate and regular rhythm.     Heart sounds: No murmur heard. Pulmonary:     Effort: Pulmonary effort is normal.     Breath sounds: No stridor. No wheezing, rhonchi or rales.  Abdominal:     General: Abdomen is flat.     Palpations: There is no mass.     Tenderness: There is no abdominal tenderness. There is no guarding.     Hernia: No hernia is present.  Musculoskeletal:        General: Normal range of motion.     Cervical back: Neck supple.     Right lower leg: No edema.     Left lower leg: No edema.  Lymphadenopathy:     Cervical: No cervical adenopathy.  Skin:    General: Skin is warm and dry.     Findings: No rash.  Neurological:     General: No focal deficit present.     Mental Status: He is alert. Mental status is at baseline.  Psychiatric:        Mood and Affect: Mood normal.        Behavior: Behavior normal.       Assessment & Plan:   Blake Luna was seen today for annual exam.  Diagnoses and all orders for this visit:  On pre-exposure prophylaxis for HIV -     Hepatic function panel; Future -     RPR; Future -     HIV Antibody (routine testing w rflx); Future -     Hepatitis A antibody, total; Future -     Hepatitis B core antibody, total; Future -     Hepatitis B surface antibody,quantitative; Future -     Hepatitis B surface antigen; Future -     Basic metabolic panel; Future -     Basic metabolic panel -     Hepatitis B surface antigen -     Hepatitis B surface antibody,quantitative -     Hepatitis B core antibody, total -     Hepatitis A antibody, total -     HIV Antibody (routine testing w rflx) -     RPR -     Hepatic function panel -     emtricitabine-tenofovir (TRUVADA) 200-300 MG tablet; Take 1 tablet by mouth daily.  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  He can continue taking PrEP.  I recommended that he get vaccinated against hepatitis A and B. -      Hepatic function panel; Future -     RPR; Future -     HIV Antibody (routine testing w rflx); Future -     Hepatitis A antibody, total; Future -     Hepatitis B core antibody, total; Future -     Hepatitis B surface antibody,quantitative; Future -     Hepatitis B surface antigen; Future -     Basic metabolic panel; Future -     Basic metabolic panel -     Hepatitis B surface antigen -     Hepatitis B surface antibody,quantitative -     Hepatitis B core antibody, total -  Hepatitis A antibody, total -     HIV Antibody (routine testing w rflx) -     RPR -     Hepatic function panel -     emtricitabine-tenofovir (TRUVADA) 200-300 MG tablet; Take 1 tablet by mouth daily.  Encounter for annual general medical examination without abnormal findings in adult- Exam completed, labs reviewed, vaccines reviewed and updated, no cancer screenings indicated, patient education was given.  Major depressive disorder with single episode, in full remission (HCC)- I instructed him on a slow taper off of bupropion over the next 3 months.   I have discontinued Blake Luna "Chris"'s fluticasone, cetirizine, azelastine, and methylPREDNISolone. I have also changed his emtricitabine-tenofovir. Additionally, I am having him maintain his albuterol and buPROPion.  Meds ordered this encounter  Medications   emtricitabine-tenofovir (TRUVADA) 200-300 MG tablet    Sig: Take 1 tablet by mouth daily.    Dispense:  90 tablet    Refill:  0     Follow-up: Return in about 3 months (around 02/05/2023).  Sanda Linger, MD

## 2022-11-07 LAB — HEPATITIS A ANTIBODY, TOTAL: Hepatitis A AB,Total: NONREACTIVE

## 2022-11-07 LAB — RPR: RPR Ser Ql: NONREACTIVE

## 2022-11-07 LAB — HIV ANTIBODY (ROUTINE TESTING W REFLEX): HIV 1&2 Ab, 4th Generation: NONREACTIVE

## 2022-11-07 LAB — HEPATITIS B SURFACE ANTIGEN: Hepatitis B Surface Ag: NONREACTIVE

## 2022-11-07 LAB — HEPATITIS B SURFACE ANTIBODY, QUANTITATIVE: Hep B S AB Quant (Post): <5 m[IU]/mL — ABNORMAL LOW (ref >=10)

## 2022-11-07 LAB — HEPATITIS B CORE ANTIBODY, TOTAL: Hep B Core Total Ab: NONREACTIVE

## 2022-11-07 MED ORDER — EMTRICITABINE-TENOFOVIR DF 200-300 MG PO TABS: 1.0000 | ORAL_TABLET | Freq: Every day | ORAL | 0 refills | Status: DC

## 2022-11-09 ENCOUNTER — Encounter: Payer: Self-pay | Admitting: Internal Medicine

## 2022-11-09 DIAGNOSIS — F325 Major depressive disorder, single episode, in full remission: Secondary | ICD-10-CM | POA: Insufficient documentation

## 2022-11-19 ENCOUNTER — Other Ambulatory Visit (HOSPITAL_COMMUNITY): Payer: Self-pay

## 2022-11-21 ENCOUNTER — Other Ambulatory Visit (HOSPITAL_COMMUNITY): Payer: Self-pay

## 2022-11-23 ENCOUNTER — Other Ambulatory Visit (HOSPITAL_COMMUNITY): Payer: Self-pay

## 2022-12-21 ENCOUNTER — Other Ambulatory Visit (HOSPITAL_COMMUNITY): Payer: Self-pay

## 2023-02-04 ENCOUNTER — Other Ambulatory Visit: Payer: Self-pay | Admitting: Internal Medicine

## 2023-02-04 DIAGNOSIS — Z79899 Other long term (current) drug therapy: Secondary | ICD-10-CM

## 2023-02-04 DIAGNOSIS — Z7253 High risk bisexual behavior: Secondary | ICD-10-CM

## 2023-02-06 ENCOUNTER — Other Ambulatory Visit: Payer: Self-pay | Admitting: Internal Medicine

## 2023-02-06 ENCOUNTER — Encounter: Payer: Self-pay | Admitting: Internal Medicine

## 2023-02-06 ENCOUNTER — Ambulatory Visit: Payer: BC Managed Care – PPO | Admitting: Internal Medicine

## 2023-02-06 VITALS — BP 132/88 | HR 73 | Temp 98.9°F | Ht 74.0 in | Wt 229.0 lb

## 2023-02-06 DIAGNOSIS — E6609 Other obesity due to excess calories: Secondary | ICD-10-CM

## 2023-02-06 DIAGNOSIS — E66811 Obesity, class 1: Secondary | ICD-10-CM

## 2023-02-06 DIAGNOSIS — Z7253 High risk bisexual behavior: Secondary | ICD-10-CM | POA: Diagnosis not present

## 2023-02-06 DIAGNOSIS — Z6830 Body mass index (BMI) 30.0-30.9, adult: Secondary | ICD-10-CM | POA: Diagnosis not present

## 2023-02-06 DIAGNOSIS — Z79899 Other long term (current) drug therapy: Secondary | ICD-10-CM

## 2023-02-06 DIAGNOSIS — Z23 Encounter for immunization: Secondary | ICD-10-CM | POA: Diagnosis not present

## 2023-02-06 MED ORDER — ZEPBOUND 2.5 MG/0.5ML ~~LOC~~ SOAJ
2.5000 mg | SUBCUTANEOUS | 0 refills | Status: DC
Start: 2023-02-06 — End: 2023-04-11

## 2023-02-06 NOTE — Patient Instructions (Addendum)
Hep B Booster in 1 month Hep A booster in 6 months    Tirzepatide Injection What is this medication? TIRZEPATIDE (tir ZEP a tide) treats type 2 diabetes. It works by increasing insulin levels in your body, which decreases your blood sugar (glucose). Changes to diet and exercise are often combined with this medication. This medicine may be used for other purposes; ask your health care provider or pharmacist if you have questions. COMMON BRAND NAME(S): MOUNJARO What should I tell my care team before I take this medication? They need to know if you have any of these conditions: Endocrine tumors (MEN 2) or if someone in your family had these tumors Eye disease, vision problems Gallbladder disease History of pancreatitis Kidney disease Stomach or intestine problems Thyroid cancer or if someone in your family had thyroid cancer An unusual or allergic reaction to tirzepatide, other medications, foods, dyes, or preservatives Pregnant or trying to get pregnant Breast-feeding How should I use this medication? This medication is injected under the skin. You will be taught how to prepare and give it. It is given once every week (every 7 days). Keep taking it unless your health care provider tells you to stop. If you use this medication with insulin, you should inject this medication and the insulin separately. Do not mix them together. Do not give the injections right next to each other. Change (rotate) injection sites with each injection. This medication comes with INSTRUCTIONS FOR USE. Ask your pharmacist for directions on how to use this medication. Read the information carefully. Talk to your pharmacist or care team if you have questions. It is important that you put your used needles and syringes in a special sharps container. Do not put them in a trash can. If you do not have a sharps container, call your pharmacist or care team to get one. A special MedGuide will be given to you by the  pharmacist with each prescription and refill. Be sure to read this information carefully each time. Talk to your care team about the use of this medication in children. Special care may be needed. Overdosage: If you think you have taken too much of this medicine contact a poison control center or emergency room at once. NOTE: This medicine is only for you. Do not share this medicine with others. What if I miss a dose? If you miss a dose, take it as soon as you can unless it is more than 4 days (96 hours) late. If it is more than 4 days late, skip the missed dose. Take the next dose at the normal time. Do not take 2 doses within 3 days of each other. What may interact with this medication? Alcohol Antiviral medications for HIV or AIDS Aspirin and aspirin-like medications Beta blockers, such as atenolol, metoprolol, propranolol Certain medications for blood pressure, heart disease, irregular heart beat Chromium Clonidine Diuretics Estrogen or progestin hormones Fenofibrate Gemfibrozil Guanethidine Isoniazid Lanreotide Male hormones or anabolic steroids MAOIs, such as Marplan, Nardil, and Parnate Medications for weight loss Medications for allergies, asthma, cold, or cough Medications for depression, anxiety, or mental health conditions Niacin Nicotine NSAIDs, medications for pain and inflammation, such as ibuprofen or naproxen Octreotide Other medications for diabetes, such as glyburide, glipizide, or glimepiride Pasireotide Pentamidine Phenytoin Probenecid Quinolone antibiotics, such as ciprofloxacin, levofloxacin, ofloxacin Reserpine Some herbal dietary supplements Steroid medications, such as prednisone or cortisone Sulfamethoxazole; trimethoprim Thyroid hormones Warfarin This list may not describe all possible interactions. Give your health care provider a list  of all the medicines, herbs, non-prescription drugs, or dietary supplements you use. Also tell them if you smoke,  drink alcohol, or use illegal drugs. Some items may interact with your medicine. What should I watch for while using this medication? Visit your care team for regular checks on your progress. Drink plenty of fluids while taking this medication. Check with your care team if you get an attack of severe diarrhea, nausea, and vomiting. The loss of too much body fluid can make it dangerous for you to take this medication. A test called the HbA1C (A1C) will be monitored. This is a simple blood test. It measures your blood sugar control over the last 2 to 3 months. You will receive this test every 3 to 6 months. Learn how to check your blood sugar. Learn the symptoms of low and high blood sugar and how to manage them. Always carry a quick-source of sugar with you in case you have symptoms of low blood sugar. Examples include hard sugar candy or glucose tablets. Make sure others know that you can choke if you eat or drink when you develop serious symptoms of low blood sugar, such as seizures or unconsciousness. They must get medical help at once. Tell your care team if you have high blood sugar. You might need to change the dose of your medication. If you are sick or exercising more than usual, you might need to change the dose of your medication. Do not skip meals. Ask your care team if you should avoid alcohol. Many nonprescription cough and cold products contain sugar or alcohol. These can affect blood sugar. Pens should never be shared. Even if the needle is changed, sharing may result in passing of viruses like hepatitis or HIV. Wear a medical ID bracelet or chain, and carry a card that describes your disease and details of your medication and dosage times. Birth control may not work properly while you are taking this medication. If you take birth control pills by mouth, your care team may recommend another type of birth control for 4 weeks after you start this medication and for 4 weeks after each increase  in your dose of this medication. Ask your care team which birth control methods you should use. What side effects may I notice from receiving this medication? Side effects that you should report to your care team as soon as possible: Allergic reactions or angioedema--skin rash, itching or hives, swelling of the face, eyes, lips, tongue, arms, or legs, trouble swallowing or breathing Change in vision Dehydration--increased thirst, dry mouth, feeling faint or lightheaded, headache, dark yellow or brown urine Gallbladder problems--severe stomach pain, nausea, vomiting, fever Kidney injury--decrease in the amount of urine, swelling of the ankles, hands, or feet Pancreatitis--severe stomach pain that spreads to your back or gets worse after eating or when touched, fever, nausea, vomiting Thyroid cancer--new mass or lump in the neck, pain or trouble swallowing, trouble breathing, hoarseness Side effects that usually do not require medical attention (report these to your care team if they continue or are bothersome): Constipation Diarrhea Loss of appetite Nausea Stomach pain Upset stomach Vomiting This list may not describe all possible side effects. Call your doctor for medical advice about side effects. You may report side effects to FDA at 1-800-FDA-1088. Where should I keep my medication? Keep out of the reach of children and pets. Refrigeration (preferred): Store in the refrigerator. Keep this medication in the original carton until you are ready to take it. Do not freeze.  Protect from light. Get rid of opened vials after use, even if there is medication left. Get rid of any unopened vials or pens after the expiration date. Room Temperature: This medication may be stored at room temperature below 30 degrees C (86 degrees F) for up to 21 days. Keep this medication in the original carton until you are ready to take it. Protect from light. Avoid exposure to extreme heat. Get rid of opened vials  after use, even if there is medication left. Get rid of any unopened vials or pens after 21 days, or after they expire, whichever is first. To get rid of medications that are no longer needed or have expired: Take the medication to a medication take-back program. Check with your pharmacy or law enforcement to find a location. If you cannot return the medication, ask your pharmacist or care team how to get rid of this medication safely. NOTE: This sheet is a summary. It may not cover all possible information. If you have questions about this medicine, talk to your doctor, pharmacist, or health care provider.  2023 Elsevier/Gold Standard (2022-09-06 00:00:00)

## 2023-02-06 NOTE — Progress Notes (Signed)
Subjective:  Patient ID: Blake Luna, male    DOB: 09/21/1981  Age: 42 y.o. MRN: [id number]  CC: Follow-up   HPI Blake Luna presents for f/up ---  He complains of weight gain and would like to start an injectable medication to help him lose weight.  Outpatient Medications Prior to Visit  Medication Sig Dispense Refill   albuterol (VENTOLIN HFA) 108 (90 Base) MCG/ACT inhaler Inhale 1-2 puffs into the lungs every 6 (six) hours as needed for wheezing or shortness of breath. 18 g 11   emtricitabine-tenofovir (TRUVADA) 200-300 MG tablet Take 1 tablet by mouth daily. 90 tablet 0   buPROPion (WELLBUTRIN XL) 150 MG 24 hr tablet TAKE 2 TABLETS BY MOUTH DAILY. (Patient not taking: Reported on 02/06/2023) 180 tablet 3   No facility-administered medications prior to visit.    ROS Review of Systems  Constitutional:  Negative for chills and fever.  HENT: Negative.    Eyes: Negative.   Respiratory:  Negative for cough, chest tightness, shortness of breath and wheezing.   Cardiovascular:  Negative for chest pain, palpitations and leg swelling.  Gastrointestinal:  Negative for abdominal pain, diarrhea, nausea and vomiting.  Endocrine: Negative.   Genitourinary: Negative.  Negative for difficulty urinating, dysuria, genital sores, hematuria, penile discharge, scrotal swelling and testicular pain.  Musculoskeletal: Negative.  Negative for myalgias.  Skin: Negative.  Negative for rash.  Neurological:  Negative for dizziness, weakness and light-headedness.  Hematological:  Negative for adenopathy. Does not bruise/bleed easily.  Psychiatric/Behavioral: Negative.      Objective:  BP 132/88 (BP Location: Left Arm, Patient Position: Sitting, Cuff Size: Normal)   Pulse 73   Temp 98.9 F (37.2 C) (Oral)   Ht 6\' 2"  (1.88 m)   Wt 229 lb (103.9 kg)   SpO2 97%   BMI 29.40 kg/m   BP Readings from Last 3 Encounters:  02/06/23 132/88  11/06/22 136/88  01/02/22 118/80    Wt  Readings from Last 3 Encounters:  02/06/23 229 lb (103.9 kg)  11/06/22 225 lb (102.1 kg)  01/02/22 220 lb (99.8 kg)    Physical Exam Vitals reviewed.  Constitutional:      Appearance: Normal appearance.  HENT:     Nose: Nose normal.     Mouth/Throat:     Mouth: Mucous membranes are moist.     Pharynx: No oropharyngeal exudate or posterior oropharyngeal erythema.  Eyes:     General: No scleral icterus.    Conjunctiva/sclera: Conjunctivae normal.  Cardiovascular:     Rate and Rhythm: Normal rate and regular rhythm.     Pulses: Normal pulses.     Heart sounds: No murmur heard. Pulmonary:     Effort: Pulmonary effort is normal.     Breath sounds: No stridor. No wheezing, rhonchi or rales.  Abdominal:     General: Abdomen is flat.     Palpations: There is no mass.     Tenderness: There is no abdominal tenderness. There is no guarding.     Hernia: No hernia is present.  Musculoskeletal:        General: Normal range of motion.     Cervical back: Neck supple. No tenderness.  Lymphadenopathy:     Cervical: No cervical adenopathy.  Skin:    General: Skin is warm.     Findings: No rash.  Neurological:     General: No focal deficit present.     Mental Status: Mental status is at baseline.  Psychiatric:  Mood and Affect: Mood normal.        Behavior: Behavior normal.     Lab Results  Component Value Date   WBC 8.1 12/04/2021   HGB 14.1 12/04/2021   HCT 43.0 12/04/2021   PLT 205.0 12/04/2021   GLUCOSE 93 11/06/2022   CHOL 141 12/04/2021   TRIG 56.0 12/04/2021   HDL 53.50 12/04/2021   LDLCALC 76 12/04/2021   ALT 32 11/06/2022   AST 21 11/06/2022   NA 138 11/06/2022   K 4.0 11/06/2022   CL 103 11/06/2022   CREATININE 1.07 11/06/2022   BUN 14 11/06/2022   CO2 26 11/06/2022   TSH 1.98 12/04/2021   HGBA1C 5.4 12/04/2021    No results found.  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 Will continue PrEP. -     Hepatitis B surface  antigen; Future -     HIV Antibody (routine testing w rflx); Future -     Emtricitabine-Tenofovir DF; Take 1 tablet by mouth daily.  Dispense: 90 tablet; Refill: 0  On pre-exposure prophylaxis for HIV -     Hepatitis B surface antigen; Future -     HIV Antibody (routine testing w rflx); Future -     Emtricitabine-Tenofovir DF; Take 1 tablet by mouth daily.  Dispense: 90 tablet; Refill: 0  Class 1 obesity due to excess calories with serious comorbidity and body mass index (BMI) of 30.0 to 30.9 in adult -     Zepbound; Inject 2.5 mg into the skin once a week.  Dispense: 4 mL; Refill: 0  Need for hepatitis A vaccination -     Hepatitis A vaccine adult IM  Need for hepatitis B vaccination -     Heplisav-B (HepB-CPG) Vaccine     Follow-up: Return in about 6 months (around 08/08/2023).  Blake Linger, MD

## 2023-02-07 LAB — HIV ANTIBODY (ROUTINE TESTING W REFLEX): HIV 1&2 Ab, 4th Generation: NONREACTIVE

## 2023-02-07 LAB — HEPATITIS B SURFACE ANTIGEN: Hepatitis B Surface Ag: NONREACTIVE

## 2023-02-07 MED ORDER — EMTRICITABINE-TENOFOVIR DF 200-300 MG PO TABS: 1.0000 | ORAL_TABLET | Freq: Every day | ORAL | 0 refills | Status: DC

## 2023-02-08 ENCOUNTER — Encounter: Payer: Self-pay | Admitting: Internal Medicine

## 2023-02-11 NOTE — Telephone Encounter (Signed)
Need prior auth.. forward to Prior Auth team../lmb

## 2023-02-28 ENCOUNTER — Telehealth: Payer: Self-pay

## 2023-02-28 ENCOUNTER — Other Ambulatory Visit (HOSPITAL_COMMUNITY): Payer: Self-pay

## 2023-02-28 NOTE — Telephone Encounter (Signed)
Per test claim, weight loss drugs not covered by plan. Please sign off on encounter as PAP team is unable to sign off on Rx Requests. Thank you.

## 2023-02-28 NOTE — Telephone Encounter (Signed)
Per test claim, weight loss meds are not covered by [facility]

## 2023-03-08 ENCOUNTER — Ambulatory Visit: Payer: BC Managed Care – PPO

## 2023-03-13 ENCOUNTER — Ambulatory Visit (INDEPENDENT_AMBULATORY_CARE_PROVIDER_SITE_OTHER): Payer: BC Managed Care – PPO

## 2023-03-13 DIAGNOSIS — Z23 Encounter for immunization: Secondary | ICD-10-CM

## 2023-03-13 NOTE — Progress Notes (Signed)
After obtaining consent, and per orders of Dr. Yetta Barre, injection of Hep B given by Ferdie Ping. Patient instructed to report any adverse reaction to me immediately.

## 2023-04-11 ENCOUNTER — Other Ambulatory Visit: Payer: Self-pay | Admitting: Internal Medicine

## 2023-04-11 ENCOUNTER — Encounter: Payer: Self-pay | Admitting: Internal Medicine

## 2023-04-11 DIAGNOSIS — Z6830 Body mass index (BMI) 30.0-30.9, adult: Secondary | ICD-10-CM

## 2023-04-15 ENCOUNTER — Other Ambulatory Visit (HOSPITAL_COMMUNITY): Payer: Self-pay

## 2023-05-31 ENCOUNTER — Other Ambulatory Visit: Payer: Self-pay | Admitting: Internal Medicine

## 2023-05-31 DIAGNOSIS — Z7253 High risk bisexual behavior: Secondary | ICD-10-CM

## 2023-05-31 DIAGNOSIS — Z79899 Other long term (current) drug therapy: Secondary | ICD-10-CM

## 2023-06-10 ENCOUNTER — Encounter: Payer: Self-pay | Admitting: Internal Medicine

## 2023-06-24 ENCOUNTER — Ambulatory Visit: Payer: BC Managed Care – PPO | Admitting: Internal Medicine

## 2023-06-24 ENCOUNTER — Other Ambulatory Visit: Payer: Self-pay | Admitting: Internal Medicine

## 2023-06-24 ENCOUNTER — Encounter: Payer: Self-pay | Admitting: Internal Medicine

## 2023-06-24 VITALS — BP 136/82 | HR 73 | Temp 98.1°F | Ht 74.0 in | Wt 236.0 lb

## 2023-06-24 DIAGNOSIS — Z7253 High risk bisexual behavior: Secondary | ICD-10-CM

## 2023-06-24 DIAGNOSIS — Z0000 Encounter for general adult medical examination without abnormal findings: Secondary | ICD-10-CM

## 2023-06-24 DIAGNOSIS — Z79899 Other long term (current) drug therapy: Secondary | ICD-10-CM

## 2023-06-24 MED ORDER — DOXYCYCLINE HYCLATE 100 MG PO TABS
200.0000 mg | ORAL_TABLET | Freq: Every day | ORAL | 3 refills | Status: DC | PRN
Start: 2023-06-24 — End: 2023-06-24

## 2023-06-24 MED ORDER — DOXYCYCLINE HYCLATE 100 MG PO TABS
200.0000 mg | ORAL_TABLET | Freq: Every day | ORAL | 3 refills | Status: DC | PRN
Start: 2023-06-24 — End: 2023-11-30

## 2023-06-24 MED ORDER — DOXYCYCLINE HYCLATE 100 MG PO TABS: 200.0000 mg | ORAL_TABLET | Freq: Every day | ORAL | 3 refills | Status: DC | PRN

## 2023-06-24 NOTE — Progress Notes (Unsigned)
Subjective:  Patient ID: Blake Luna, male    DOB: 10-04-1981  Age: 42 y.o. MRN: [id number]  CC: Annual Exam   HPI Ched Nedrow presents for a CPX and f/up ---  He would like to start Doxy-PrEP.  Outpatient Medications Prior to Visit  Medication Sig Dispense Refill   emtricitabine-tenofovir (TRUVADA) 200-300 MG tablet TAKE 1 TABLET BY MOUTH EVERY DAY 90 tablet 0   No facility-administered medications prior to visit.    ROS Review of Systems  Constitutional: Negative.  Negative for chills, diaphoresis, fatigue and fever.  HENT: Negative.  Negative for sore throat and trouble swallowing.   Eyes: Negative.   Respiratory:  Negative for cough, chest tightness, shortness of breath and wheezing.   Cardiovascular:  Negative for chest pain, palpitations and leg swelling.  Gastrointestinal:  Negative for abdominal pain, constipation, diarrhea, nausea and vomiting.  Genitourinary: Negative.  Negative for decreased urine volume, difficulty urinating, dysuria, flank pain, genital sores, hematuria, penile discharge, penile pain, penile swelling, scrotal swelling, testicular pain and urgency.  Musculoskeletal: Negative.   Skin:  Negative for rash.  Neurological:  Negative for dizziness and weakness.  Hematological:  Negative for adenopathy. Does not bruise/bleed easily.  Psychiatric/Behavioral: Negative.      Objective:  BP 136/82 (BP Location: Left Arm, Patient Position: Sitting, Cuff Size: Large)   Pulse 73   Temp 98.1 F (36.7 C) (Oral)   Ht 6\' 2"  (1.88 m)   Wt 236 lb (107 kg)   SpO2 97%   BMI 30.30 kg/m   BP Readings from Last 3 Encounters:  06/24/23 136/82  02/06/23 132/88  11/06/22 136/88    Wt Readings from Last 3 Encounters:  06/24/23 236 lb (107 kg)  02/06/23 229 lb (103.9 kg)  11/06/22 225 lb (102.1 kg)    Physical Exam Vitals reviewed.  Constitutional:      Appearance: Normal appearance.  HENT:     Mouth/Throat:     Mouth: Mucous  membranes are moist.  Eyes:     General: No scleral icterus.    Conjunctiva/sclera: Conjunctivae normal.  Cardiovascular:     Rate and Rhythm: Normal rate and regular rhythm.     Heart sounds: No murmur heard. Pulmonary:     Effort: Pulmonary effort is normal.     Breath sounds: No stridor. No wheezing, rhonchi or rales.  Abdominal:     General: Abdomen is flat.     Palpations: There is no mass.     Tenderness: There is no abdominal tenderness. There is no guarding.     Hernia: No hernia is present.  Musculoskeletal:        General: Normal range of motion.     Cervical back: Neck supple.     Right lower leg: No edema.     Left lower leg: No edema.  Lymphadenopathy:     Cervical: No cervical adenopathy.  Skin:    General: Skin is warm.     Findings: No rash.  Neurological:     General: No focal deficit present.     Mental Status: He is alert. Mental status is at baseline.  Psychiatric:        Mood and Affect: Mood normal.        Behavior: Behavior normal.     Lab Results  Component Value Date   WBC 8.1 12/04/2021   HGB 14.1 12/04/2021   HCT 43.0 12/04/2021   PLT 205.0 12/04/2021   GLUCOSE 93 11/06/2022   CHOL 141 12/04/2021  TRIG 56.0 12/04/2021   HDL 53.50 12/04/2021   LDLCALC 76 12/04/2021   ALT 32 11/06/2022   AST 21 11/06/2022   NA 138 11/06/2022   K 4.0 11/06/2022   CL 103 11/06/2022   CREATININE 1.07 11/06/2022   BUN 14 11/06/2022   CO2 26 11/06/2022   TSH 1.98 12/04/2021   HGBA1C 5.4 12/04/2021    No results found.  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  Dispense: 6  tablet; Refill: 3  On pre-exposure prophylaxis for HIV -     GC/Chlamydia Probe Amp; Future -     Urinalysis, Routine w reflex microscopic; Future -     Hepatitis B surface antigen; Future -     HIV Antibody (routine testing w rflx); Future -     RPR; Future     Follow-up: Return in about 3 months (around 09/24/2023).  Sanda Linger, MD

## 2023-06-24 NOTE — Patient Instructions (Signed)
Health Maintenance, Male Adopting a healthy lifestyle and getting preventive care are important in promoting health and wellness. Ask your health care provider about: The right schedule for you to have regular tests and exams. Things you can do on your own to prevent diseases and keep yourself healthy. What should I know about diet, weight, and exercise? Eat a healthy diet  Eat a diet that includes plenty of vegetables, fruits, low-fat dairy products, and lean protein. Do not eat a lot of foods that are high in solid fats, added sugars, or sodium. Maintain a healthy weight Body mass index (BMI) is a measurement that can be used to identify possible weight problems. It estimates body fat based on height and weight. Your health care provider can help determine your BMI and help you achieve or maintain a healthy weight. Get regular exercise Get regular exercise. This is one of the most important things you can do for your health. Most adults should: Exercise for at least 150 minutes each week. The exercise should increase your heart rate and make you sweat (moderate-intensity exercise). Do strengthening exercises at least twice a week. This is in addition to the moderate-intensity exercise. Spend less time sitting. Even light physical activity can be beneficial. Watch cholesterol and blood lipids Have your blood tested for lipids and cholesterol at 42 years of age, then have this test every 5 years. You may need to have your cholesterol levels checked more often if: Your lipid or cholesterol levels are high. You are older than 42 years of age. You are at high risk for heart disease. What should I know about cancer screening? Many types of cancers can be detected early and may often be prevented. Depending on your health history and family history, you may need to have cancer screening at various ages. This may include screening for: Colorectal cancer. Prostate cancer. Skin cancer. Lung  cancer. What should I know about heart disease, diabetes, and high blood pressure? Blood pressure and heart disease High blood pressure causes heart disease and increases the risk of stroke. This is more likely to develop in people who have high blood pressure readings or are overweight. Talk with your health care provider about your target blood pressure readings. Have your blood pressure checked: Every 3-5 years if you are 18-39 years of age. Every year if you are 40 years old or older. If you are between the ages of 65 and 75 and are a current or former smoker, ask your health care provider if you should have a one-time screening for abdominal aortic aneurysm (AAA). Diabetes Have regular diabetes screenings. This checks your fasting blood sugar level. Have the screening done: Once every three years after age 45 if you are at a normal weight and have a low risk for diabetes. More often and at a younger age if you are overweight or have a high risk for diabetes. What should I know about preventing infection? Hepatitis B If you have a higher risk for hepatitis B, you should be screened for this virus. Talk with your health care provider to find out if you are at risk for hepatitis B infection. Hepatitis C Blood testing is recommended for: Everyone born from 1945 through 1965. Anyone with known risk factors for hepatitis C. Sexually transmitted infections (STIs) You should be screened each year for STIs, including gonorrhea and chlamydia, if: You are sexually active and are younger than 42 years of age. You are older than 42 years of age and your   health care provider tells you that you are at risk for this type of infection. Your sexual activity has changed since you were last screened, and you are at increased risk for chlamydia or gonorrhea. Ask your health care provider if you are at risk. Ask your health care provider about whether you are at high risk for HIV. Your health care provider  may recommend a prescription medicine to help prevent HIV infection. If you choose to take medicine to prevent HIV, you should first get tested for HIV. You should then be tested every 3 months for as long as you are taking the medicine. Follow these instructions at home: Alcohol use Do not drink alcohol if your health care provider tells you not to drink. If you drink alcohol: Limit how much you have to 0-2 drinks a day. Know how much alcohol is in your drink. In the [place] one drink equals one 12 oz bottle of beer (355 mL), one 5 oz glass of wine (148 mL), or one 1 oz glass of hard liquor (44 mL). Lifestyle Do not use any products that contain nicotine or tobacco. These products include cigarettes, chewing tobacco, and vaping devices, such as e-cigarettes. If you need help quitting, ask your health care provider. Do not use street drugs. Do not share needles. Ask your health care provider for help if you need support or information about quitting drugs. General instructions Schedule regular health, dental, and eye exams. Stay current with your vaccines. Tell your health care provider if: You often feel depressed. You have ever been abused or do not feel safe at home. Summary Adopting a healthy lifestyle and getting preventive care are important in promoting health and wellness. Follow your health care provider's instructions about healthy diet, exercising, and getting tested or screened for diseases. Follow your health care provider's instructions on monitoring your cholesterol and blood pressure. This information is not intended to replace advice given to you by your health care provider. Make sure you discuss any questions you have with your health care provider. Document Revised: 03/13/2021 Document Reviewed: 03/13/2021 Elsevier Patient Education  2024 Elsevier Inc.  

## 2023-06-25 LAB — HEPATITIS B SURFACE ANTIGEN: Hepatitis B Surface Ag: NONREACTIVE

## 2023-06-25 LAB — HIV ANTIBODY (ROUTINE TESTING W REFLEX): HIV 1&2 Ab, 4th Generation: NONREACTIVE

## 2023-06-25 LAB — RPR: RPR Ser Ql: NONREACTIVE

## 2023-06-26 LAB — GC/CHLAMYDIA PROBE AMP
Chlamydia trachomatis, NAA: NEGATIVE
Neisseria Gonorrhoeae by PCR: NEGATIVE

## 2023-08-08 ENCOUNTER — Encounter: Payer: Self-pay | Admitting: Internal Medicine

## 2023-08-08 ENCOUNTER — Ambulatory Visit: Payer: BC Managed Care – PPO | Admitting: Internal Medicine

## 2023-08-08 VITALS — BP 122/80 | HR 74 | Temp 98.3°F | Resp 16 | Ht 74.0 in | Wt 220.0 lb

## 2023-08-08 DIAGNOSIS — Z79899 Other long term (current) drug therapy: Secondary | ICD-10-CM

## 2023-08-08 DIAGNOSIS — Z23 Encounter for immunization: Secondary | ICD-10-CM | POA: Diagnosis not present

## 2023-08-08 NOTE — Progress Notes (Signed)
Subjective:  Patient ID: Blake Luna, male    DOB: Mar 14, 1981  Age: 42 y.o. MRN: [id number]  CC: Follow-up   HPI Lieutenant Abarca presents for f/up ---  Discussed the use of AI scribe software for clinical note transcription with the patient, who gave verbal consent to proceed.  History of Present Illness   The patient presents for a follow-up visit, with no reported side effects from their previous treatment. They deny experiencing any symptoms such as sore throat, fever, chills, or night sweats. The patient is due for their second Hepatitis A vaccination, but has not yet received a flu shot, which they typically decline. They report feeling well overall, with no new or ongoing health concerns.       Outpatient Medications Prior to Visit  Medication Sig Dispense Refill   doxycycline (VIBRA-TABS) 100 MG tablet Take 2 tablets (200 mg total) by mouth daily as needed. 6 tablet 3   emtricitabine-tenofovir (TRUVADA) 200-300 MG tablet TAKE 1 TABLET BY MOUTH EVERY DAY 90 tablet 0   No facility-administered medications prior to visit.    ROS Review of Systems  Constitutional: Negative.   HENT: Negative.    Eyes: Negative.   Respiratory: Negative.    Cardiovascular: Negative.   Gastrointestinal: Negative.   Genitourinary: Negative.  Negative for difficulty urinating.  Musculoskeletal: Negative.   Skin: Negative.   Hematological: Negative.   Psychiatric/Behavioral: Negative.      Objective:  BP 122/80 (BP Location: Left Arm, Patient Position: Sitting, Cuff Size: Large)   Pulse 74   Temp 98.3 F (36.8 C) (Oral)   Resp 16   Ht 6\' 2"  (1.88 m)   Wt 220 lb (99.8 kg)   SpO2 94%   BMI 28.25 kg/m   BP Readings from Last 3 Encounters:  08/08/23 122/80  06/24/23 136/82  02/06/23 132/88    Wt Readings from Last 3 Encounters:  08/08/23 220 lb (99.8 kg)  06/24/23 236 lb (107 kg)  02/06/23 229 lb (103.9 kg)    Physical Exam Vitals reviewed.  HENT:      Mouth/Throat:     Mouth: Mucous membranes are moist.  Eyes:     General: No scleral icterus.    Conjunctiva/sclera: Conjunctivae normal.  Cardiovascular:     Rate and Rhythm: Normal rate and regular rhythm.     Heart sounds: No murmur heard. Pulmonary:     Effort: Pulmonary effort is normal.     Breath sounds: No stridor. No wheezing, rhonchi or rales.  Abdominal:     General: Abdomen is flat.     Palpations: There is no mass.     Tenderness: There is no abdominal tenderness. There is no guarding.     Hernia: No hernia is present.  Musculoskeletal:        General: Normal range of motion.     Cervical back: Neck supple.     Right lower leg: No edema.     Left lower leg: No edema.  Lymphadenopathy:     Cervical: No cervical adenopathy.  Skin:    General: Skin is warm and dry.  Neurological:     General: No focal deficit present.     Mental Status: He is alert. Mental status is at baseline.  Psychiatric:        Mood and Affect: Mood normal.        Behavior: Behavior normal.     Lab Results  Component Value Date   WBC 8.1 12/04/2021   HGB 14.1  12/04/2021   HCT 43.0 12/04/2021   PLT 205.0 12/04/2021   GLUCOSE 93 11/06/2022   CHOL 141 12/04/2021   TRIG 56.0 12/04/2021   HDL 53.50 12/04/2021   LDLCALC 76 12/04/2021   ALT 32 11/06/2022   AST 21 11/06/2022   NA 138 11/06/2022   K 4.0 11/06/2022   CL 103 11/06/2022   CREATININE 1.07 11/06/2022   BUN 14 11/06/2022   CO2 26 11/06/2022   TSH 1.98 12/04/2021   HGBA1C 5.4 12/04/2021    No results found.  Assessment & Plan:   On pre-exposure prophylaxis for HIV - Will continue PrEP.  Other orders -     Hepatitis A vaccine adult IM     Follow-up: Return in about 3 months (around 11/08/2023).  Sanda Linger, MD

## 2023-08-08 NOTE — Patient Instructions (Signed)
Hepatitis A Vaccine Injection What is this medication? HEPATITIS A VACCINE (hep uh TAHY tis A VAK seen) reduces the risk of hepatitis A. It does not treat hepatitis A. It is still possible to get hepatitis A after receiving this vaccine, but the symptoms may be less severe or not last as long. It works by helping your immune system learn how to fight off a future infection. This medicine may be used for other purposes; ask your health care provider or pharmacist if you have questions. COMMON BRAND NAME(S): Havrix, Havrix Pediatric, Vaqta What should I tell my care team before I take this medication? They need to know if you have any of these conditions: Bleeding disorder Fever or infection Heart disease Immune system problems An unusual or allergic reaction to hepatitis A vaccine, latex, other medications, foods, dyes, or preservatives Pregnant or trying to get pregnant Breastfeeding How should I use this medication? This vaccine is injected into a muscle. It is given by your care team. A copy of Vaccine Information Statements will be given before each vaccination. Be sure to read this information carefully each time. The sheet may change often. Talk to your care team about the use of this medication in children. While it may be prescribed for children as young as 42 months of age for selected conditions, precautions do apply. Overdosage: If you think you have taken too much of this medicine contact a poison control center or emergency room at once. NOTE: This medicine is only for you. Do not share this medicine with others. What if I miss a dose? Keep appointments for follow-up (booster) doses as directed. It is important not to miss your dose. Call your care team if you are unable to keep an appointment. What may interact with this medication? Certain medications that suppress your immune function, such as adalimumab, anakinra, etanercept, infliximab Certain medications to treat  cancer Steroid medications, such as prednisone or cortisone This list may not describe all possible interactions. Give your health care provider a list of all the medicines, herbs, non-prescription drugs, or dietary supplements you use. Also tell them if you smoke, drink alcohol, or use illegal drugs. Some items may interact with your medicine. What should I watch for while using this medication? See your care team for a booster shot of this vaccine as directed. Tell your care team right away if you have any serious or unusual side effects after getting this vaccine. You will not have protection from the hepatitis A virus for at least 8 to 10 days after your first injection. The length of time you will have protection from hepatitis A virus infection is not known. Check with your care team if you have questions about your immunity. See your care team before you travel out of the country. What side effects may I notice from receiving this medication? Side effects that you should report to your care team as soon as possible: Allergic reactions--skin rash, itching, hives, swelling of the face, lips, tongue, or throat Burning or tingling sensation in hands or feet Feeling faint or lightheaded Side effects that usually do not require medical attention (report these to your care team if they continue or are bothersome): Fatigue Fever Loss of appetite Nausea Pain, redness, or irritation at injection site This list may not describe all possible side effects. Call your doctor for medical advice about side effects. You may report side effects to FDA at 1-800-FDA-1088. Where should I keep my medication? This vaccine is only given  by your care team. It will not be stored at home. NOTE: This sheet is a summary. It may not cover all possible information. If you have questions about this medicine, talk to your doctor, pharmacist, or health care provider.  2024 Elsevier/Gold Standard (2022-03-30 00:00:00)

## 2023-08-26 ENCOUNTER — Encounter: Payer: Self-pay | Admitting: Internal Medicine

## 2023-08-27 ENCOUNTER — Encounter: Payer: Self-pay | Admitting: Emergency Medicine

## 2023-08-27 ENCOUNTER — Ambulatory Visit: Payer: BC Managed Care – PPO | Admitting: Emergency Medicine

## 2023-08-27 VITALS — BP 122/78 | HR 71 | Temp 98.6°F | Ht 74.0 in | Wt 215.2 lb

## 2023-08-27 DIAGNOSIS — R4582 Worries: Secondary | ICD-10-CM | POA: Diagnosis not present

## 2023-08-27 DIAGNOSIS — Z202 Contact with and (suspected) exposure to infections with a predominantly sexual mode of transmission: Secondary | ICD-10-CM | POA: Insufficient documentation

## 2023-08-27 DIAGNOSIS — Z79899 Other long term (current) drug therapy: Secondary | ICD-10-CM | POA: Diagnosis not present

## 2023-08-27 NOTE — Patient Instructions (Signed)
Health Maintenance, Male Adopting a healthy lifestyle and getting preventive care are important in promoting health and wellness. Ask your health care provider about: The right schedule for you to have regular tests and exams. Things you can do on your own to prevent diseases and keep yourself healthy. What should I know about diet, weight, and exercise? Eat a healthy diet  Eat a diet that includes plenty of vegetables, fruits, low-fat dairy products, and lean protein. Do not eat a lot of foods that are high in solid fats, added sugars, or sodium. Maintain a healthy weight Body mass index (BMI) is a measurement that can be used to identify possible weight problems. It estimates body fat based on height and weight. Your health care provider can help determine your BMI and help you achieve or maintain a healthy weight. Get regular exercise Get regular exercise. This is one of the most important things you can do for your health. Most adults should: Exercise for at least 150 minutes each week. The exercise should increase your heart rate and make you sweat (moderate-intensity exercise). Do strengthening exercises at least twice a week. This is in addition to the moderate-intensity exercise. Spend less time sitting. Even light physical activity can be beneficial. Watch cholesterol and blood lipids Have your blood tested for lipids and cholesterol at 42 years of age, then have this test every 5 years. You may need to have your cholesterol levels checked more often if: Your lipid or cholesterol levels are high. You are older than 42 years of age. You are at high risk for heart disease. What should I know about cancer screening? Many types of cancers can be detected early and may often be prevented. Depending on your health history and family history, you may need to have cancer screening at various ages. This may include screening for: Colorectal cancer. Prostate cancer. Skin cancer. Lung  cancer. What should I know about heart disease, diabetes, and high blood pressure? Blood pressure and heart disease High blood pressure causes heart disease and increases the risk of stroke. This is more likely to develop in people who have high blood pressure readings or are overweight. Talk with your health care provider about your target blood pressure readings. Have your blood pressure checked: Every 3-5 years if you are 18-39 years of age. Every year if you are 40 years old or older. If you are between the ages of 65 and 75 and are a current or former smoker, ask your health care provider if you should have a one-time screening for abdominal aortic aneurysm (AAA). Diabetes Have regular diabetes screenings. This checks your fasting blood sugar level. Have the screening done: Once every three years after age 45 if you are at a normal weight and have a low risk for diabetes. More often and at a younger age if you are overweight or have a high risk for diabetes. What should I know about preventing infection? Hepatitis B If you have a higher risk for hepatitis B, you should be screened for this virus. Talk with your health care provider to find out if you are at risk for hepatitis B infection. Hepatitis C Blood testing is recommended for: Everyone born from 1945 through 1965. Anyone with known risk factors for hepatitis C. Sexually transmitted infections (STIs) You should be screened each year for STIs, including gonorrhea and chlamydia, if: You are sexually active and are younger than 42 years of age. You are older than 42 years of age and your   health care provider tells you that you are at risk for this type of infection. Your sexual activity has changed since you were last screened, and you are at increased risk for chlamydia or gonorrhea. Ask your health care provider if you are at risk. Ask your health care provider about whether you are at high risk for HIV. Your health care provider  may recommend a prescription medicine to help prevent HIV infection. If you choose to take medicine to prevent HIV, you should first get tested for HIV. You should then be tested every 3 months for as long as you are taking the medicine. Follow these instructions at home: Alcohol use Do not drink alcohol if your health care provider tells you not to drink. If you drink alcohol: Limit how much you have to 0-2 drinks a day. Know how much alcohol is in your drink. In the [place] one drink equals one 12 oz bottle of beer (355 mL), one 5 oz glass of wine (148 mL), or one 1 oz glass of hard liquor (44 mL). Lifestyle Do not use any products that contain nicotine or tobacco. These products include cigarettes, chewing tobacco, and vaping devices, such as e-cigarettes. If you need help quitting, ask your health care provider. Do not use street drugs. Do not share needles. Ask your health care provider for help if you need support or information about quitting drugs. General instructions Schedule regular health, dental, and eye exams. Stay current with your vaccines. Tell your health care provider if: You often feel depressed. You have ever been abused or do not feel safe at home. Summary Adopting a healthy lifestyle and getting preventive care are important in promoting health and wellness. Follow your health care provider's instructions about healthy diet, exercising, and getting tested or screened for diseases. Follow your health care provider's instructions on monitoring your cholesterol and blood pressure. This information is not intended to replace advice given to you by your health care provider. Make sure you discuss any questions you have with your health care provider. Document Revised: 03/13/2021 Document Reviewed: 03/13/2021 Elsevier Patient Education  2024 Elsevier Inc.  

## 2023-08-27 NOTE — Assessment & Plan Note (Signed)
Very concerned about possible exposure. However asymptomatic. Recommend testing before treatment today.

## 2023-08-27 NOTE — Assessment & Plan Note (Signed)
Possible exposure.  Questionable timing. Asymptomatic. Recommend testing for gonorrhea today. Will treat if positive.

## 2023-08-27 NOTE — Assessment & Plan Note (Signed)
Clinically stable.  Asymptomatic. Wants HIV testing done today.

## 2023-08-27 NOTE — Progress Notes (Signed)
Blake Luna 42 y.o.   Chief Complaint  Patient presents with   Exposure to STD    Patient states his partner told him he tested positive Gonorrhea a couple of days ago. Patient states not having any symptoms.Does have f/u appt with jones on November 6.    HISTORY OF PRESENT ILLNESS: This is a 42 y.o. male concerned about sexual partner recently testing positive for gonorrhea. No recent sexual encounter with this patient.  Not steady partner. Patient asymptomatic. Also requesting routine HIV testing today instead of November 6 as already scheduled.  Exposure to STD Pertinent negatives include no abdominal pain, chills, coughing, dysuria, fever, flank pain, frequency, headaches, nausea, rash, shortness of breath, sore throat or vomiting.     Prior to Admission medications   Medication Sig Start Date End Date Taking? Authorizing Provider  doxycycline (VIBRA-TABS) 100 MG tablet Take 2 tablets (200 mg total) by mouth daily as needed. 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 06/17/2020   Encounter for annual general medical examination without abnormal findings in adult 02/18/2018   Allergic rhinitis 02/04/2018   HNP (herniated nucleus pulposus), lumbar 10/13/2015    Past Medical History:  Diagnosis Date   Allergy    Asthma    childhood   Chicken pox    HNP (herniated nucleus pulposus), lumbar    URI (upper respiratory infection)    09/2021 given Augmentin [facility] [address] [facility] as of  11/03/21    Past Surgical History:  Procedure Laterality Date   BACK SURGERY     repair of ruptured disc L4/L5    LUMBAR LAMINECTOMY/DECOMPRESSION MICRODISCECTOMY Right 10/13/2015   Procedure: MICRO LUMBAR DECOMPRESSION L5 - S1 ON THE RIGHT, with specimen of l5-s1;  Surgeon: Jene Every, MD;  Location: [facility];  Service: Orthopedics;  Laterality: Right;    Social History   Socioeconomic History   Marital status: Legally Separated    Spouse name: Not on file   Number of children: Not on file   Years of education: Not on file   Highest education level: Not on file  Occupational History   Not on file  Tobacco Use   Smoking status: Never    Passive exposure: Never   Smokeless tobacco: Never  Substance and Sexual Activity   Alcohol use: Yes    Alcohol/week: 5.0 standard drinks of alcohol    Types: 5 Glasses of wine per week    Comment: occasional wine   Drug use: No   Sexual activity: Yes    Partners: Male  Other Topics Concern   Not on file  Social History Narrative   divorced, wife 04/2020   2 kids  67 y.o daughter 7 y.o son    Psychologist, 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    Bisexual    Wears Therapist, occupational in relationship   Social Determinants of Health   Financial Resource Strain: Not on file  Food Insecurity: Not on file  Transportation Needs: Not on file  Physical Activity: Not on file  Stress: Not on file  Social Connections: Not on file  Intimate Partner Violence: Not on file    Family History  Problem Relation Age of Onset   Cancer Father        liver   Diabetes Father    Heart disease Father        chf   Early death Father        age 90    Hyperlipidemia Father    Hypertension Father    Stroke Father    Alcohol abuse Sister    Depression Sister    Drug abuse Sister    Arthritis Maternal Grandmother    Cancer Maternal Grandmother    Hyperlipidemia Maternal Grandmother    Hypertension Maternal  Grandmother    Glaucoma Maternal Grandmother    Arthritis Maternal Grandfather    Heart disease Maternal Grandfather    Hyperlipidemia Maternal Grandfather    Hypertension Maternal Grandfather    Arthritis Paternal Grandfather    Depression Paternal Grandfather    Hyperlipidemia Paternal Grandfather    Heart disease Paternal Grandfather    Hypertension Paternal Grandfather    Stroke Paternal Grandfather      Review of Systems  Constitutional: Negative.  Negative for chills and fever.  HENT:  Negative for congestion and sore throat.   Respiratory: Negative.  Negative for cough and shortness of breath.   Gastrointestinal:  Negative for abdominal pain, nausea and vomiting.  Genitourinary: Negative.  Negative for dysuria, flank pain, frequency and hematuria.  Skin: Negative.  Negative for rash.  Neurological:  Negative for dizziness and headaches.  All other systems reviewed and are negative.   Vitals:   08/27/23 1059  BP: 122/78  Pulse: 71  Temp: 98.6 F (37 C)  SpO2: 98%    Physical Exam Vitals reviewed.  Constitutional:      Appearance: Normal appearance.  Eyes:     Extraocular Movements: Extraocular movements intact.  Cardiovascular:     Rate and Rhythm: Normal rate.  Pulmonary:     Effort: Pulmonary effort is normal.  Skin:    General: Skin is warm and dry.     Capillary Refill: Capillary refill takes less than 2 seconds.  Neurological:     Mental Status: He is alert and oriented to person, place, and time.  Psychiatric:        Mood and Affect: Mood normal.        Behavior: Behavior normal.      ASSESSMENT & PLAN: Problem List Items Addressed This Visit       Other   On pre-exposure prophylaxis for HIV    Clinically stable.  Asymptomatic. Wants HIV testing done today.      Relevant Orders   HIV Antibody (routine testing w rflx)   Worries - Primary    Very concerned about possible exposure. However asymptomatic. Recommend testing before  treatment today.      Exposure to gonorrhea    Possible exposure.  Questionable timing. Asymptomatic. Recommend testing for gonorrhea today. Will treat if positive.      Relevant Orders   GC/Chlamydia Probe Amp    Patient Instructions  Health Maintenance, Male Adopting a healthy lifestyle and getting preventive care are important in promoting health and wellness. Ask your health care provider about: The right schedule for you to have regular tests and exams. Things you can do on your own to  prevent diseases and keep yourself healthy. What should I know about diet, weight, and exercise? Eat a healthy diet  Eat a diet that includes plenty of vegetables, fruits, low-fat dairy products, and lean protein. Do not eat a lot of foods that are high in solid fats, added sugars, or sodium. Maintain a healthy weight Body mass index (BMI) is a measurement that can be used to identify possible weight problems. It estimates body fat based on height and weight. Your health care provider can help determine your BMI and help you achieve or maintain a healthy weight. Get regular exercise Get regular exercise. This is one of the most important things you can do for your health. Most adults should: Exercise for at least 150 minutes each week. The exercise should increase your heart rate and make you sweat (moderate-intensity exercise). Do strengthening exercises at least twice a week. This is in addition to the moderate-intensity exercise. Spend less time sitting. Even light physical activity can be beneficial. Watch cholesterol and blood lipids Have your blood tested for lipids and cholesterol at 42 years of age, then have this test every 5 years. You may need to have your cholesterol levels checked more often if: Your lipid or cholesterol levels are high. You are older than 42 years of age. You are at high risk for heart disease. What should I know about cancer screening? Many types of cancers can  be detected early and may often be prevented. Depending on your health history and family history, you may need to have cancer screening at various ages. This may include screening for: Colorectal cancer. Prostate cancer. Skin cancer. Lung cancer. What should I know about heart disease, diabetes, and high blood pressure? Blood pressure and heart disease High blood pressure causes heart disease and increases the risk of stroke. This is more likely to develop in people who have high blood pressure readings or are overweight. Talk with your health care provider about your target blood pressure readings. Have your blood pressure checked: Every 3-5 years if you are 42-87 years of age. Every year if you are 33 years old or older. If you are between the ages of 35 and 56 and are a current or former smoker, ask your health care provider if you should have a one-time screening for abdominal aortic aneurysm (AAA). Diabetes Have regular diabetes screenings. This checks your fasting blood sugar level. Have the screening done: Once every three years after age 42 if you are at a normal weight and have a low risk for diabetes. More often and at a younger age if you are overweight or have a high risk for diabetes. What should I know about preventing infection? Hepatitis B If you have a higher risk for hepatitis B, you should be screened for this virus. Talk with your health care provider to find out if you are at risk for hepatitis B infection. Hepatitis C Blood testing is recommended for: Everyone born from [birth year] through 1965. Anyone with known risk factors for hepatitis C. Sexually transmitted infections (STIs) You should be screened each year for STIs, including gonorrhea and chlamydia, if: You are sexually active and are younger than 42 years of age. You are older than 42 years of age and your health care provider tells you that you are at risk for this type of infection. Your sexual activity has  changed since you were last screened, and you are at increased risk for chlamydia or gonorrhea. Ask your health care provider if you  are at risk. Ask your health care provider about whether you are at high risk for HIV. Your health care provider may recommend a prescription medicine to help prevent HIV infection. If you choose to take medicine to prevent HIV, you should first get tested for HIV. You should then be tested every 3 months for as long as you are taking the medicine. Follow these instructions at home: Alcohol use Do not drink alcohol if your health care provider tells you not to drink. If you drink alcohol: Limit how much you have to 0-2 drinks a day. Know how much alcohol is in your drink. In the [place] one drink equals one 12 oz bottle of beer (355 mL), one 5 oz glass of wine (148 mL), or one 1 oz glass of hard liquor (44 mL). Lifestyle Do not use any products that contain nicotine or tobacco. These products include cigarettes, chewing tobacco, and vaping devices, such as e-cigarettes. If you need help quitting, ask your health care provider. Do not use street drugs. Do not share needles. Ask your health care provider for help if you need support or information about quitting drugs. General instructions Schedule regular health, dental, and eye exams. Stay current with your vaccines. Tell your health care provider if: You often feel depressed. You have ever been abused or do not feel safe at home. Summary Adopting a healthy lifestyle and getting preventive care are important in promoting health and wellness. Follow your health care provider's instructions about healthy diet, exercising, and getting tested or screened for diseases. Follow your health care provider's instructions on monitoring your cholesterol and blood pressure. This information is not intended to replace advice given to you by your health care provider. Make sure you discuss any questions you have with your health  care provider. Document Revised: 03/13/2021 Document Reviewed: 03/13/2021 Elsevier Patient Education  2024 Elsevier Inc.      Edwina Barth, MD [facility]

## 2023-08-28 LAB — HIV ANTIBODY (ROUTINE TESTING W REFLEX): HIV 1&2 Ab, 4th Generation: NONREACTIVE

## 2023-08-31 LAB — GC/CHLAMYDIA PROBE AMP
Chlamydia trachomatis, NAA: NEGATIVE
Neisseria Gonorrhoeae by PCR: NEGATIVE

## 2023-09-08 ENCOUNTER — Other Ambulatory Visit: Payer: Self-pay | Admitting: Internal Medicine

## 2023-09-08 DIAGNOSIS — Z7253 High risk bisexual behavior: Secondary | ICD-10-CM

## 2023-09-08 DIAGNOSIS — Z79899 Other long term (current) drug therapy: Secondary | ICD-10-CM

## 2023-09-11 ENCOUNTER — Ambulatory Visit: Payer: BC Managed Care – PPO | Admitting: Internal Medicine

## 2023-09-23 ENCOUNTER — Ambulatory Visit: Payer: BC Managed Care – PPO | Admitting: Internal Medicine

## 2023-11-20 ENCOUNTER — Telehealth: Payer: Self-pay | Admitting: Internal Medicine

## 2023-11-20 NOTE — Telephone Encounter (Signed)
 LVM for patient to return call.  Dr.Jones, can patient be scheduled for just labs or does he need an OV?

## 2023-11-20 NOTE — Telephone Encounter (Signed)
Copied from CRM [id number]. Topic: Clinical - Medical Advice >> Nov 20, 2023  2:45 PM Isabell A wrote: Reason for CRM: Patient calling to confirm if he can come in and get labs completed to get a refill for emtricitabine-tenofovir (TRUVADA) 200-300 MG tablet - states he has to be tested every 90 days. Next available appointment with Dr.Jones is in February, patient wanted to be seen sooner.

## 2023-11-28 ENCOUNTER — Encounter: Payer: Self-pay | Admitting: Internal Medicine

## 2023-11-28 ENCOUNTER — Ambulatory Visit: Payer: BC Managed Care – PPO | Admitting: Internal Medicine

## 2023-11-28 VITALS — BP 124/76 | HR 70 | Temp 98.0°F | Ht 74.0 in | Wt 208.2 lb

## 2023-11-28 DIAGNOSIS — Z79899 Other long term (current) drug therapy: Secondary | ICD-10-CM | POA: Diagnosis not present

## 2023-11-28 DIAGNOSIS — Z7253 High risk bisexual behavior: Secondary | ICD-10-CM

## 2023-11-28 LAB — CBC WITH DIFFERENTIAL/PLATELET
Basophils Absolute: 0 10*3/uL (ref 0.0–0.1)
Basophils Relative: 0.4 % (ref 0.0–3.0)
Eosinophils Absolute: 0.3 10*3/uL (ref 0.0–0.7)
Eosinophils Relative: 3.8 % (ref 0.0–5.0)
HCT: 46.6 % (ref 39.0–52.0)
Hemoglobin: 15.3 g/dL (ref 13.0–17.0)
Lymphocytes Relative: 24.1 % (ref 12.0–46.0)
Lymphs Abs: 1.8 10*3/uL (ref 0.7–4.0)
MCHC: 32.7 g/dL (ref 30.0–36.0)
MCV: 92.6 fL (ref 78.0–100.0)
Monocytes Absolute: 0.5 10*3/uL (ref 0.1–1.0)
Monocytes Relative: 7.2 % (ref 3.0–12.0)
Neutro Abs: 4.8 10*3/uL (ref 1.4–7.7)
Neutrophils Relative %: 64.5 % (ref 43.0–77.0)
Platelets: 229 10*3/uL (ref 150.0–400.0)
RBC: 5.04 Mil/uL (ref 4.22–5.81)
RDW: 13.9 % (ref 11.5–15.5)
WBC: 7.4 10*3/uL (ref 4.0–10.5)

## 2023-11-28 LAB — URINALYSIS, ROUTINE W REFLEX MICROSCOPIC
Bilirubin Urine: NEGATIVE
Hgb urine dipstick: NEGATIVE
Ketones, ur: NEGATIVE
Leukocytes,Ua: NEGATIVE
Nitrite: NEGATIVE
RBC / HPF: NONE SEEN (ref 0–?)
Specific Gravity, Urine: 1.01 (ref 1.000–1.030)
Total Protein, Urine: NEGATIVE
Urine Glucose: NEGATIVE
Urobilinogen, UA: 0.2 (ref 0.0–1.0)
pH: 6 (ref 5.0–8.0)

## 2023-11-28 LAB — HEPATIC FUNCTION PANEL
ALT: 20 U/L (ref 0–53)
AST: 18 U/L (ref 0–37)
Albumin: 4.9 g/dL (ref 3.5–5.2)
Alkaline Phosphatase: 50 U/L (ref 39–117)
Bilirubin, Direct: 0.1 mg/dL (ref 0.0–0.3)
Total Bilirubin: 0.5 mg/dL (ref 0.2–1.2)
Total Protein: 7.8 g/dL (ref 6.0–8.3)

## 2023-11-28 LAB — BASIC METABOLIC PANEL
BUN: 23 mg/dL (ref 6–23)
CO2: 30 meq/L (ref 19–32)
Calcium: 9.8 mg/dL (ref 8.4–10.5)
Chloride: 101 meq/L (ref 96–112)
Creatinine, Ser: 0.98 mg/dL (ref 0.40–1.50)
GFR: 94.96 mL/min (ref >60.00)
Glucose, Bld: 92 mg/dL (ref 70–99)
Potassium: 4.8 meq/L (ref 3.5–5.1)
Sodium: 138 meq/L (ref 135–145)

## 2023-11-28 NOTE — Progress Notes (Signed)
Subjective:  Patient ID: Blake Luna, male    DOB: 09-07-1981  Age: 43 y.o. MRN: [id number]  CC: Follow-up   HPI Blake Luna presents for f/up ----  Discussed the use of AI scribe software for clinical note transcription with the patient, who gave verbal consent to proceed.  History of Present Illness   The patient, who was last seen in November, reports no new suspicious symptoms since the last visit. He denies sore throat or painful urination. The patient confirms continued use of Truvada and doxycycline, with the latter taken as needed. He reports no findings from the testing done in November. The patient generally does not receive flu shots and has not had one this season.  He denies any testicular pain or swelling and any recent rashes. The patient's blood pressure is noted to be excellent at 128.       Outpatient Medications Prior to Visit  Medication Sig Dispense Refill   doxycycline (VIBRA-TABS) 100 MG tablet Take 2 tablets (200 mg total) by mouth daily as needed. 6 tablet 3   emtricitabine-tenofovir (TRUVADA) 200-300 MG tablet TAKE 1 TABLET BY MOUTH EVERY DAY 90 tablet 0   No facility-administered medications prior to visit.    ROS Review of Systems  Constitutional: Negative.  Negative for chills, diaphoresis, fatigue and fever.  HENT: Negative.  Negative for sore throat, trouble swallowing and voice change.   Eyes: Negative.   Respiratory:  Negative for cough, chest tightness and wheezing.   Cardiovascular:  Negative for chest pain, palpitations and leg swelling.  Gastrointestinal:  Negative for abdominal pain, diarrhea, nausea and vomiting.  Endocrine: Negative.   Genitourinary: Negative.  Negative for difficulty urinating, dysuria, penile discharge, penile swelling and scrotal swelling.  Musculoskeletal: Negative.  Negative for arthralgias and myalgias.  Skin: Negative.  Negative for rash.  Neurological: Negative.   Hematological:  Negative  for adenopathy. Does not bruise/bleed easily.  Psychiatric/Behavioral: Negative.      Objective:  BP 124/76 (BP Location: Left Arm, Patient Position: Sitting, Cuff Size: Normal)   Pulse 70   Temp 98 F (36.7 C) (Oral)   Ht 6\' 2"  (1.88 m)   Wt 208 lb 3.2 oz (94.4 kg)   SpO2 99%   BMI 26.73 kg/m   BP Readings from Last 3 Encounters:  11/28/23 124/76  08/27/23 122/78  08/08/23 122/80    Wt Readings from Last 3 Encounters:  11/28/23 208 lb 3.2 oz (94.4 kg)  08/27/23 215 lb 3.2 oz (97.6 kg)  08/08/23 220 lb (99.8 kg)    Physical Exam Vitals reviewed.  Constitutional:      Appearance: Normal appearance.  HENT:     Nose: Nose normal.     Mouth/Throat:     Mouth: Mucous membranes are moist.  Eyes:     General: No scleral icterus.    Conjunctiva/sclera: Conjunctivae normal.  Cardiovascular:     Rate and Rhythm: Normal rate and regular rhythm.     Heart sounds: No murmur heard.    No friction rub. No gallop.  Pulmonary:     Effort: Pulmonary effort is normal.     Breath sounds: No stridor. No wheezing, rhonchi or rales.  Abdominal:     General: Abdomen is flat.     Palpations: There is no mass.     Tenderness: There is no abdominal tenderness. There is no guarding.     Hernia: No hernia is present.  Musculoskeletal:        General: Normal range  of motion.     Cervical back: Neck supple.     Right lower leg: No edema.     Left lower leg: No edema.  Lymphadenopathy:     Cervical: No cervical adenopathy.  Skin:    General: Skin is warm.     Findings: No rash.  Neurological:     General: No focal deficit present.     Mental Status: He is alert. Mental status is at baseline.  Psychiatric:        Mood and Affect: Mood normal.        Behavior: Behavior normal.     Lab Results  Component Value Date   WBC 7.4 11/28/2023   HGB 15.3 11/28/2023   HCT 46.6 11/28/2023   PLT 229.0 11/28/2023   GLUCOSE 92 11/28/2023   CHOL 141 12/04/2021   TRIG 56.0 12/04/2021    HDL 53.50 12/04/2021   LDLCALC 76 12/04/2021   ALT 20 11/28/2023   AST 18 11/28/2023   NA 138 11/28/2023   K 4.8 11/28/2023   CL 101 11/28/2023   CREATININE 0.98 11/28/2023   BUN 23 11/28/2023   CO2 30 11/28/2023   TSH 1.98 12/04/2021   HGBA1C 5.4 12/04/2021    No results found.  Assessment & Plan:  On pre-exposure prophylaxis for HIV -     CBC with Differential/Platelet; Future -     Hepatic function panel; Future -     Basic metabolic panel; Future -     HIV Antibody (routine testing w rflx); Future -     RPR; Future -     GC/Chlamydia Probe Amp; Future -     Urinalysis, Routine w reflex microscopic; Future -     Hepatitis B surface antigen; Future -     Emtricitabine-Tenofovir DF; Take 1 tablet by mouth daily.  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 Will continue HIV and DoxyPrEP. -     CBC with Differential/Platelet; Future -     Hepatic function panel; Future -     Basic metabolic panel; Future -     HIV Antibody (routine testing w rflx); Future -     RPR; Future -     GC/Chlamydia Probe Amp; Future -     Urinalysis, Routine w reflex microscopic; Future -     Hepatitis B surface antigen; Future -     Doxycycline Hyclate; Take 2 tablets (200 mg total) by mouth daily as needed.  Dispense: 6 tablet; Refill: 3 -     Emtricitabine-Tenofovir DF; Take 1 tablet by mouth daily.  Dispense: 90 tablet; Refill: 0     Follow-up: Return in about 6 months (around 05/27/2024).  Sanda Linger, MD

## 2023-11-28 NOTE — Patient Instructions (Signed)
How to Have Safe Sex Having safe sex means taking steps before and during sex to reduce your risk of: Getting a sexually transmitted infection (STI). Giving your partner an STI. Unwanted or unplanned pregnancy. How to have safe sex Ways you can have safe sex  Limit your sex partners to only one partner who is only having sex with you. Avoid using alcohol and drugs before having sex. Alcohol and drugs can affect your judgment. Before having sex with a new partner: Talk to your partner about past partners, past STIs, and drug use. Get screened for STIs and discuss the results with your partner. Ask your partner to get screened too. Check your body regularly for sores, blisters, rashes, or unusual discharge. If you notice any of these things, call your health care provider. Avoid sexual contact if you have symptoms of an infection or you're being treated for an STI. While having sex, use a condom. Make sure to: Use a condom every time you have vaginal, oral, or anal sex. Both females and males should wear condoms during oral sex. Do not use a male condom and a male condom at the same time during vaginal sex. Using both types at the same time can cause condoms to break. Keep condoms in place from the beginning to the end of sexual activity. Use a latex condom, if possible. Latex condoms offer the best protection. Use only water-based lubricants with a condom. Using petroleum-based lubricants or oils will weaken the condom and increase the chance that it will break. Ways your health care provider can help you have safe sex  See your provider for regular screenings, exams, and tests for STIs. Talk with your provider about what kind of birth control is best for you. Get vaccinated against hepatitis B and human papillomavirus (HPV). If you're at risk of getting human immunodeficiency virus (HIV), talk with your provider about taking a medicine to prevent HIV. You're at risk for HIV if you: Are  a male who has sex with other males. Are sexually active with more than one partner. Take drugs by injection. Have a sex partner who has HIV. Have unprotected sex. Have sex with someone who has sex with both males and females. Follow these instructions at home: Take your medicines only as told. Call your provider if you think you might be pregnant. Call your provider if have any symptoms of an infection. Where to find more information Centers for Disease Control and Prevention (CDC): [employer]no Office on Women's Health: [employer] This information is not intended to replace advice given to you by your health care provider. Make sure you discuss any questions you have with your health care provider. Document Revised: 03/13/2023 Document Reviewed: 03/13/2023 Elsevier Patient Education  2024 [employer].

## 2023-11-29 LAB — HEPATITIS B SURFACE ANTIGEN: Hepatitis B Surface Ag: NONREACTIVE

## 2023-11-29 LAB — RPR: RPR Ser Ql: NONREACTIVE

## 2023-11-29 LAB — HIV ANTIBODY (ROUTINE TESTING W REFLEX): HIV 1&2 Ab, 4th Generation: NONREACTIVE

## 2023-11-30 ENCOUNTER — Encounter: Payer: Self-pay | Admitting: Internal Medicine

## 2023-11-30 MED ORDER — EMTRICITABINE-TENOFOVIR DF 200-300 MG PO TABS: 1.0000 | ORAL_TABLET | Freq: Every day | ORAL | 0 refills | Status: DC

## 2023-11-30 MED ORDER — DOXYCYCLINE HYCLATE 100 MG PO TABS: 200.0000 mg | ORAL_TABLET | Freq: Every day | ORAL | 3 refills | Status: DC | PRN

## 2023-12-01 LAB — GC/CHLAMYDIA PROBE AMP
Chlamydia trachomatis, NAA: NEGATIVE
Neisseria Gonorrhoeae by PCR: NEGATIVE

## 2024-02-24 ENCOUNTER — Other Ambulatory Visit: Payer: Self-pay | Admitting: Internal Medicine

## 2024-03-06 ENCOUNTER — Other Ambulatory Visit: Payer: Self-pay | Admitting: Internal Medicine

## 2024-03-06 DIAGNOSIS — Z7253 High risk bisexual behavior: Secondary | ICD-10-CM

## 2024-03-06 DIAGNOSIS — Z79899 Other long term (current) drug therapy: Secondary | ICD-10-CM

## 2024-03-10 ENCOUNTER — Other Ambulatory Visit: Payer: Self-pay | Admitting: Internal Medicine

## 2024-03-10 DIAGNOSIS — Z7253 High risk bisexual behavior: Secondary | ICD-10-CM

## 2024-03-10 DIAGNOSIS — Z79899 Other long term (current) drug therapy: Secondary | ICD-10-CM

## 2024-03-13 MED ORDER — EMTRICITABINE-TENOFOVIR DF 200-300 MG PO TABS: 1.0000 | ORAL_TABLET | Freq: Every day | ORAL | 1 refills | Status: DC

## 2024-03-13 NOTE — Addendum Note (Signed)
 Addended by: Tremond Shimabukuro , Jemima Petko M on: 03/13/2024 04:44 PM   Modules accepted: Orders

## 2024-03-14 MED ORDER — EMTRICITABINE-TENOFOVIR DF 200-300 MG PO TABS: 1.0000 | ORAL_TABLET | Freq: Every day | ORAL | 0 refills | Status: DC

## 2024-04-07 ENCOUNTER — Ambulatory Visit: Admitting: Internal Medicine

## 2024-04-07 ENCOUNTER — Encounter: Payer: Self-pay | Admitting: Internal Medicine

## 2024-04-07 VITALS — BP 126/76 | HR 79 | Temp 98.2°F | Ht 74.0 in | Wt 207.4 lb

## 2024-04-07 DIAGNOSIS — Z202 Contact with and (suspected) exposure to infections with a predominantly sexual mode of transmission: Secondary | ICD-10-CM | POA: Diagnosis not present

## 2024-04-07 DIAGNOSIS — J301 Allergic rhinitis due to pollen: Secondary | ICD-10-CM

## 2024-04-07 DIAGNOSIS — Z79899 Other long term (current) drug therapy: Secondary | ICD-10-CM | POA: Diagnosis not present

## 2024-04-07 DIAGNOSIS — Z7253 High risk bisexual behavior: Secondary | ICD-10-CM | POA: Diagnosis not present

## 2024-04-07 DIAGNOSIS — H6993 Unspecified Eustachian tube disorder, bilateral: Secondary | ICD-10-CM | POA: Insufficient documentation

## 2024-04-07 LAB — URINALYSIS, ROUTINE W REFLEX MICROSCOPIC
Bilirubin Urine: NEGATIVE
Hgb urine dipstick: NEGATIVE
Ketones, ur: NEGATIVE
Leukocytes,Ua: NEGATIVE
Nitrite: NEGATIVE
Specific Gravity, Urine: 1.025 (ref 1.000–1.030)
Total Protein, Urine: NEGATIVE
Urine Glucose: NEGATIVE
Urobilinogen, UA: 0.2 (ref 0.0–1.0)
pH: 6 (ref 5.0–8.0)

## 2024-04-07 MED ORDER — METHYLPREDNISOLONE 4 MG PO TBPK: ORAL_TABLET | ORAL | 0 refills | Status: AC

## 2024-04-07 NOTE — Patient Instructions (Signed)
 Eustachian Tube Dysfunction  Eustachian tube dysfunction refers to a condition in which a blockage develops in the narrow passage that connects the middle ear to the back of the nose (eustachian tube). The eustachian tube regulates air pressure in the middle ear by letting air move between the ear and nose. It also helps to drain fluid from the middle ear space. Eustachian tube dysfunction can affect one or both ears. When the eustachian tube does not function properly, air pressure, fluid, or both can build up in the middle ear. What are the causes? This condition occurs when the eustachian tube becomes blocked or cannot open normally. Common causes of this condition include: Ear infections. Colds and other infections that affect the nose, mouth, and throat (upper respiratory tract). Allergies. Irritation from cigarette smoke. Irritation from stomach acid coming up into the esophagus (gastroesophageal reflux). The esophagus is the part of the body that moves food from the mouth to the stomach. Sudden changes in air pressure, such as from descending in an airplane or scuba diving. Abnormal growths in the nose or throat, such as: Growths that line the nose (nasal polyps). Abnormal growth of cells (tumors). Enlarged tissue at the back of the throat (adenoids). What increases the risk? You are more likely to develop this condition if: You smoke. You are overweight. You are a child who has: Certain birth defects of the mouth, such as cleft palate. Large tonsils or adenoids. What are the signs or symptoms? Common symptoms of this condition include: A feeling of fullness in the ear. Ear pain. Clicking or popping noises in the ear. Ringing in the ear (tinnitus). Hearing loss. Loss of balance. Dizziness. Symptoms may get worse when the air pressure around you changes, such as when you travel to an area of high elevation, fly on an airplane, or go scuba diving. How is this diagnosed? This  condition may be diagnosed based on: Your symptoms. A physical exam of your ears, nose, and throat. Tests, such as those that measure: The movement of your eardrum. Your hearing (audiometry). How is this treated? Treatment depends on the cause and severity of your condition. In mild cases, you may relieve your symptoms by moving air into your ears. This is called "popping the ears." In more severe cases, or if you have symptoms of fluid in your ears, treatment may include: Medicines to relieve congestion (decongestants). Medicines that treat allergies (antihistamines). Nasal sprays or ear drops that contain medicines that reduce swelling (steroids). A procedure to drain the fluid in your eardrum. In this procedure, a small tube may be placed in the eardrum to: Drain the fluid. Restore the air in the middle ear space. A procedure to insert a balloon device through the nose to inflate the opening of the eustachian tube (balloon dilation). Follow these instructions at home: Lifestyle Do not do any of the following until your health care provider approves: Travel to high altitudes. Fly in airplanes. Work in a Estate agent or room. Scuba dive. Do not use any products that contain nicotine or tobacco. These products include cigarettes, chewing tobacco, and vaping devices, such as e-cigarettes. If you need help quitting, ask your health care provider. Keep your ears dry. Wear fitted earplugs during showering and bathing. Dry your ears completely after. General instructions Take over-the-counter and prescription medicines only as told by your health care provider. Use techniques to help pop your ears as recommended by your health care provider. These may include: Chewing gum. Yawning. Frequent, forceful swallowing.  Closing your mouth, holding your nose closed, and gently blowing as if you are trying to blow air out of your nose. Keep all follow-up visits. This is important. Contact a  health care provider if: Your symptoms do not go away after treatment. Your symptoms come back after treatment. You are unable to pop your ears. You have: A fever. Pain in your ear. Pain in your head or neck. Fluid draining from your ear. Your hearing suddenly changes. You become very dizzy. You lose your balance. Get help right away if: You have a sudden, severe increase in any of your symptoms. Summary Eustachian tube dysfunction refers to a condition in which a blockage develops in the eustachian tube. It can be caused by ear infections, allergies, inhaled irritants, or abnormal growths in the nose or throat. Symptoms may include ear pain or fullness, hearing loss, or ringing in the ears. Mild cases are treated with techniques to unblock the ears, such as yawning or chewing gum. More severe cases are treated with medicines or procedures. This information is not intended to replace advice given to you by your health care provider. Make sure you discuss any questions you have with your health care provider. Document Revised: 01/02/2021 Document Reviewed: 01/02/2021 Elsevier Patient Education  2024 [employer].

## 2024-04-07 NOTE — Progress Notes (Unsigned)
 Subjective:  Patient ID: Blake Luna, male    DOB: Jun 21, 1981  Age: 43 y.o. MRN: [id number]  CC: Medication Refill (Truvada )   HPI Blake Luna presents for f/up ----  Discussed the use of AI scribe software for clinical note transcription with the patient, who gave verbal consent to proceed.  History of Present Illness   Blake Luna "Larinda Plover" is a 43 year old male who presents with concerns of fluid in his ears, possibly related to allergies.  He experiences a sensation of fluid in his ears, described as a 'weird shift' that occurs suddenly. This sensation has been present for a long time and is bothersome. He suspects it may be related to allergies, although he denies any nasal symptoms. He takes Claritin  daily for allergies.  He denies sore throat, swollen lymph nodes, rashes, signs of gonorrhea or chlamydia, lumps, bumps, abdominal pain, nausea, vomiting, testicular pain, swelling, wheezing, and shortness of breath.  He is currently taking Truvada  without any side effects and has taken doxycycline  once two weeks ago, which he tolerated well.       Outpatient Medications Prior to Visit  Medication Sig Dispense Refill   doxycycline  (VIBRA -TABS) 100 MG tablet Take 2 tablets (200 mg total) by mouth daily as needed. 6 tablet 3   emtricitabine -tenofovir  (TRUVADA ) 200-300 MG tablet Take 1 tablet by mouth daily. 90 tablet 0   No facility-administered medications prior to visit.    ROS Review of Systems  HENT:  Positive for congestion, ear pain, postnasal drip and rhinorrhea. Negative for facial swelling, nosebleeds, sinus pain and sore throat.   Respiratory: Negative.  Negative for cough, chest tightness, shortness of breath and wheezing.   Cardiovascular:  Negative for chest pain, palpitations and leg swelling.  Gastrointestinal: Negative.  Negative for abdominal pain, constipation, diarrhea, nausea and vomiting.  Genitourinary: Negative.  Negative for  dysuria, genital sores, penile discharge, scrotal swelling and testicular pain.  Musculoskeletal: Negative.   Skin: Negative.  Negative for color change and rash.  Hematological:  Negative for adenopathy. Does not bruise/bleed easily.  Psychiatric/Behavioral: Negative.      Objective:  BP 126/76 (BP Location: Left Arm, Patient Position: Sitting, Cuff Size: Normal)   Pulse 79   Temp 98.2 F (36.8 C) (Temporal)   Ht 6\' 2"  (1.88 m)   Wt 207 lb 6.4 oz (94.1 kg)   SpO2 98%   BMI 26.63 kg/m   BP Readings from Last 3 Encounters:  04/07/24 126/76  11/28/23 124/76  08/27/23 122/78    Wt Readings from Last 3 Encounters:  04/07/24 207 lb 6.4 oz (94.1 kg)  11/28/23 208 lb 3.2 oz (94.4 kg)  08/27/23 215 lb 3.2 oz (97.6 kg)    Physical Exam HENT:     Right Ear: Hearing, tympanic membrane, ear canal and external ear normal.     Left Ear: Hearing, tympanic membrane, ear canal and external ear normal.     Lab Results  Component Value Date   WBC 7.4 11/28/2023   HGB 15.3 11/28/2023   HCT 46.6 11/28/2023   PLT 229.0 11/28/2023   GLUCOSE 92 11/28/2023   CHOL 141 12/04/2021   TRIG 56.0 12/04/2021   HDL 53.50 12/04/2021   LDLCALC 76 12/04/2021   ALT 20 11/28/2023   AST 18 11/28/2023   NA 138 11/28/2023   K 4.8 11/28/2023   CL 101 11/28/2023   CREATININE 0.98 11/28/2023   BUN 23 11/28/2023   CO2 30 11/28/2023   TSH 1.98 12/04/2021  HGBA1C 5.4 12/04/2021    No results found.  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 -     Urinalysis, Routine w reflex microscopic; Future -     GC/Chlamydia Probe Amp; Future -     HIV Antibody (routine testing w rflx); Future -     RPR; Future -     Hepatitis B surface antigen; Future  Exposure to gonorrhea -     Urinalysis, Routine w reflex microscopic; Future -     GC/Chlamydia Probe Amp; Future  On pre-exposure prophylaxis for HIV -     Urinalysis, Routine w reflex microscopic; Future -     HIV Antibody (routine testing w  rflx); Future -     RPR; Future -     Hepatitis B surface antigen; Future  Seasonal allergic rhinitis due to pollen -     methylPREDNISolone ; TAKE AS DIRECTED  Dispense: 21 tablet; Refill: 0  Eustachian tube dysfunction, bilateral -     methylPREDNISolone ; TAKE AS DIRECTED  Dispense: 21 tablet; Refill: 0     Follow-up: Return in about 6 months (around 10/07/2024).  Sandra Crouch, MD

## 2024-04-08 LAB — RPR: RPR Ser Ql: NONREACTIVE

## 2024-04-08 LAB — HIV ANTIBODY (ROUTINE TESTING W REFLEX): HIV 1&2 Ab, 4th Generation: NONREACTIVE

## 2024-04-08 LAB — HEPATITIS B SURFACE ANTIGEN: Hepatitis B Surface Ag: NONREACTIVE

## 2024-04-09 ENCOUNTER — Ambulatory Visit: Payer: Self-pay | Admitting: Internal Medicine

## 2024-04-09 LAB — GC/CHLAMYDIA PROBE AMP
Chlamydia trachomatis, NAA: NEGATIVE
Neisseria Gonorrhoeae by PCR: NEGATIVE

## 2024-04-09 MED ORDER — EMTRICITABINE-TENOFOVIR DF 200-300 MG PO TABS: 1.0000 | ORAL_TABLET | Freq: Every day | ORAL | 0 refills | Status: DC

## 2024-04-09 MED ORDER — DOXYCYCLINE HYCLATE 100 MG PO TABS
200.0000 mg | ORAL_TABLET | Freq: Every day | ORAL | 3 refills | Status: DC | PRN
Start: 2024-04-09 — End: 2024-06-15

## 2024-06-15 ENCOUNTER — Other Ambulatory Visit: Payer: Self-pay | Admitting: Internal Medicine

## 2024-06-15 ENCOUNTER — Encounter: Payer: Self-pay | Admitting: Internal Medicine

## 2024-06-15 DIAGNOSIS — Z79899 Other long term (current) drug therapy: Secondary | ICD-10-CM

## 2024-06-15 DIAGNOSIS — Z7253 High risk bisexual behavior: Secondary | ICD-10-CM

## 2024-06-15 MED ORDER — EMTRICITABINE-TENOFOVIR DF 200-300 MG PO TABS: 1.0000 | ORAL_TABLET | Freq: Every day | ORAL | 0 refills | Status: AC

## 2024-06-15 MED ORDER — DOXYCYCLINE HYCLATE 100 MG PO TABS: 200.0000 mg | ORAL_TABLET | Freq: Every day | ORAL | 3 refills | Status: AC | PRN

## 2024-06-15 NOTE — Telephone Encounter (Signed)
 Pt requesting lab orders so he can get his refills

## 2024-08-28 ENCOUNTER — Other Ambulatory Visit: Payer: Self-pay
